# Patient Record
Sex: Male | Born: 1975 | Race: White | Hispanic: No | Marital: Married | Smoking: Former smoker
Health system: Southern US, Community
[De-identification: ages and names within clinical notes are randomized; demographics above are authoritative.]

## PROBLEM LIST (undated history)

## (undated) DIAGNOSIS — E7800 Pure hypercholesterolemia, unspecified: Secondary | ICD-10-CM

## (undated) DIAGNOSIS — Z87442 Personal history of urinary calculi: Secondary | ICD-10-CM

## (undated) DIAGNOSIS — K219 Gastro-esophageal reflux disease without esophagitis: Secondary | ICD-10-CM

## (undated) DIAGNOSIS — N200 Calculus of kidney: Secondary | ICD-10-CM

## (undated) DIAGNOSIS — R519 Headache, unspecified: Secondary | ICD-10-CM

## (undated) DIAGNOSIS — R51 Headache: Secondary | ICD-10-CM

## (undated) DIAGNOSIS — F419 Anxiety disorder, unspecified: Secondary | ICD-10-CM

## (undated) HISTORY — PX: CHOLECYSTECTOMY: SHX55

---

## 2005-06-10 ENCOUNTER — Emergency Department: Payer: Self-pay | Admitting: Emergency Medicine

## 2007-04-02 ENCOUNTER — Emergency Department: Payer: Self-pay | Admitting: Internal Medicine

## 2009-06-05 ENCOUNTER — Ambulatory Visit: Payer: Self-pay | Admitting: Internal Medicine

## 2009-06-27 ENCOUNTER — Ambulatory Visit: Payer: Self-pay | Admitting: Urology

## 2010-02-16 ENCOUNTER — Ambulatory Visit: Payer: Self-pay | Admitting: Internal Medicine

## 2010-02-20 ENCOUNTER — Ambulatory Visit: Payer: Self-pay | Admitting: Internal Medicine

## 2011-01-01 ENCOUNTER — Ambulatory Visit: Payer: Self-pay | Admitting: Urology

## 2013-02-26 ENCOUNTER — Ambulatory Visit: Payer: Self-pay | Admitting: Internal Medicine

## 2013-04-13 ENCOUNTER — Ambulatory Visit: Payer: Self-pay | Admitting: Surgery

## 2013-04-14 LAB — PATHOLOGY REPORT

## 2014-07-30 NOTE — Op Note (Signed)
PATIENT NAME:  Brian Bradley, Brian Bradley DATE OF BIRTH:  04/17/1975  DATE OF PROCEDURE:  04/13/2013  PREOPERATIVE DIAGNOSIS: Chronic cholecystitis.   POSTOPERATIVE DIAGNOSIS: Chronic cholecystitis and cholelithiasis.   PROCEDURE: Laparoscopic cholecystectomy and cholangiogram.   SURGEON: Renda RollsWilton Orange Hilligoss, M.D.   ANESTHESIA: General.   INDICATION: This [age] year old male has a history of right upper quadrant pain and ultrasound findings of a particular matter within the gallbladder either polyps versus stones and surgery was recommended for definitive treatment.   DESCRIPTION OF PROCEDURE: The patient was placed on the operating table in the supine position under general anesthesia. The abdomen was clipped and prepared with ChloraPrep and draped in a sterile manner. A short incision was made in the inferior aspect of the umbilicus and carried down to the deep fascia, which was grasped with laryngeal hook and elevated. A Veress needle was inserted, aspirated, and irrigated with a saline solution. Next, the peritoneal cavity was inflated with carbon dioxide. The Veress needle was removed. A 10 mm cannula was inserted. The 10 mm, 0 degree laparoscope was inserted to view the peritoneal cavity. The liver has a smooth surface. The gallbladder was identified, the stomach identified and brief survey of the abdomen revealed no other abnormalities.   Next, another incision was made in the epigastrium slightly to the right of the midline to introduce an 11 mm cannula. Two incisions were made in the lateral aspect of the right upper quadrant to introduce two 5 mm cannulas. With the patient in the reverse Trendelenburg position and turned several degrees to the left, the gallbladder was retracted towards the right shoulder. Multiple adhesions were taken down between the omentum and the gallbladder with blunt and sharp dissection. The infundibulum was retracted inferiorly and laterally. The gallbladder neck  was mobilized with incision of the visceral peritoneum. The cystic duct was dissected free from surrounding structures. The cystic artery was dissected free from surrounding structures. A critical view of safety was demonstrated. An Endo Clip was placed across the cystic duct adjacent to the neck of the gallbladder. An incision was made in the cystic duct to introduce a Reddick catheter. Half-strength Conray-60 dye was injected as the cholangiogram was done with fluoroscopy, viewing the biliary tree and prompt flow of dye into the duodenum. The pancreatic duct was also identified. The Reddick catheter was removed. The cystic duct was doubly ligated with endoclips and divided. The cystic artery was controlled with double endoclips and divided. The gallbladder was dissected free from the liver with hook and cautery and blunt dissection and completely separated. Hemostasis was intact. The gallbladder was delivered up through the infraumbilical incision, opened and suctioned, removed. It was opened and I could see stones within the gallbladder and this was submitted in formalin for routine pathology.   The right upper quadrant was further inspected. Hemostasis was intact. The cannulas were removed. Several small subcutaneous bleeding points were cauterized as carbon dioxide was allowed to escape from the peritoneal cavity. The skin incisions were closed with interrupted 5-0 chromic subcuticular sutures, benzoin, and Steri-Strips. Dressings were applied with paper tape. The patient tolerated surgery satisfactorily and was then prepared for transfer to the recovery room.    ____________________________ Brian CommonsJ. Renda RollsWilton Bladimir Auman, Brian Bradley jws:aw D: 04/13/2013 10:45:02 ET T: 04/13/2013 10:52:25 ET JOB#: [id number]  cc: Adella HareJ. Wilton Sherial Ebrahim, Brian Bradley, <Dictator> Adella HareWILTON J Makani Seckman Brian Bradley ELECTRONICALLY SIGNED 04/15/2013 19:51

## 2015-07-06 IMAGING — US ABDOMEN ULTRASOUND
1 series · 14 of 25 positions shown · non-contrast
Comparison: None.

CLINICAL DATA: Abdominal pain, nausea

EXAM:
ULTRASOUND ABDOMEN COMPLETE

[Series 1: abdomen ultrasound · 0.24mm/px · 14 of 75 slices shown]
[im 1/75]
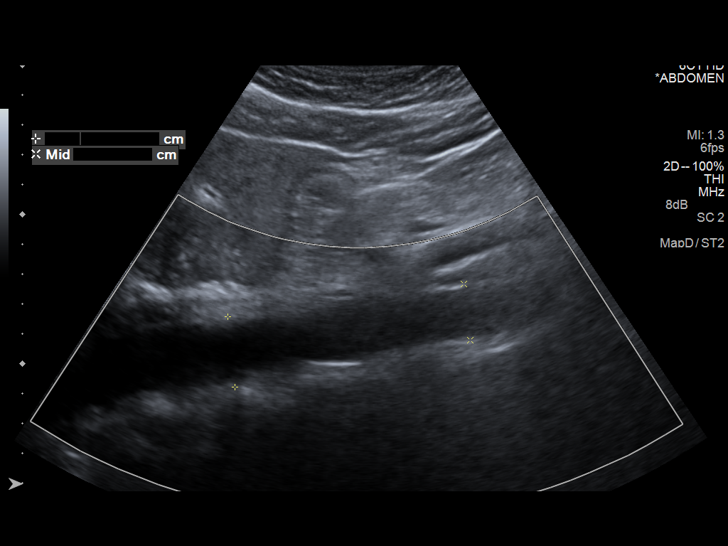
[im 7/75]
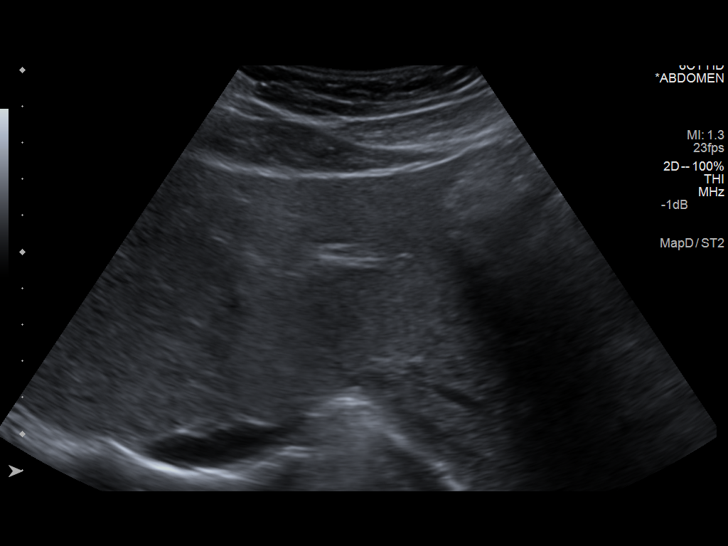
[im 13/75]
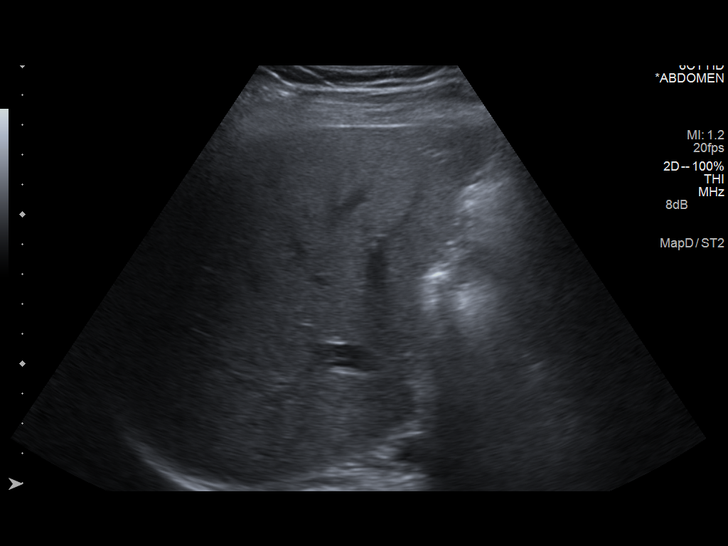
[im 19/75]
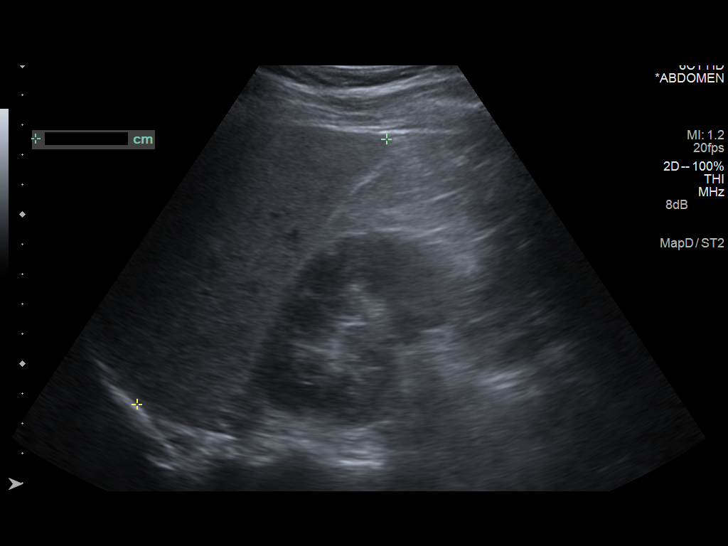
[im 25/75]
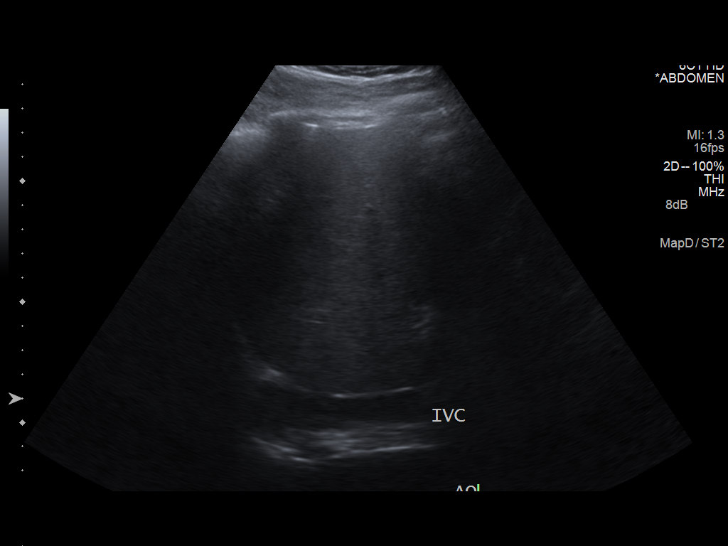
[im 28/75]
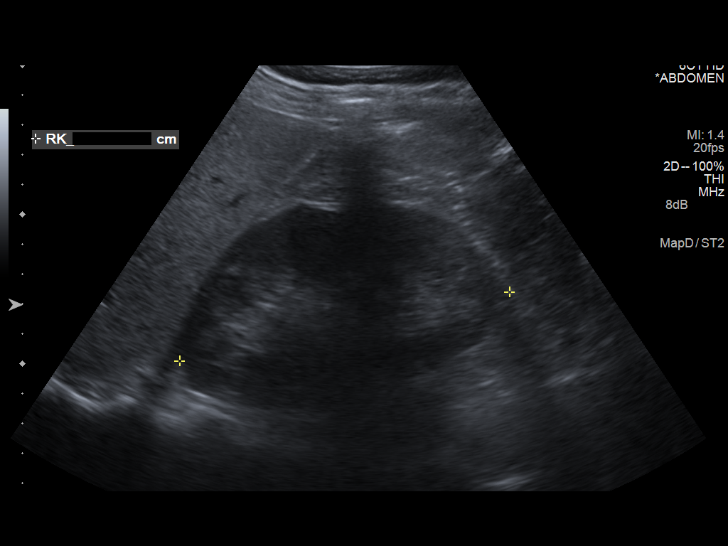
[im 34/75]
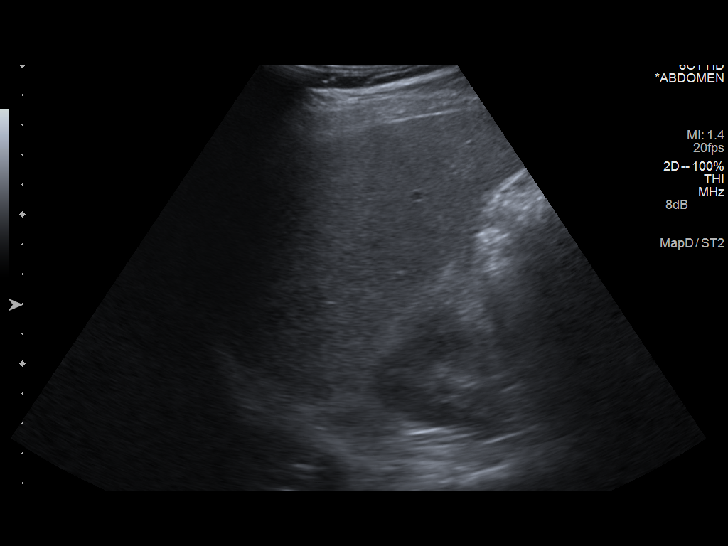
[im 41/75]
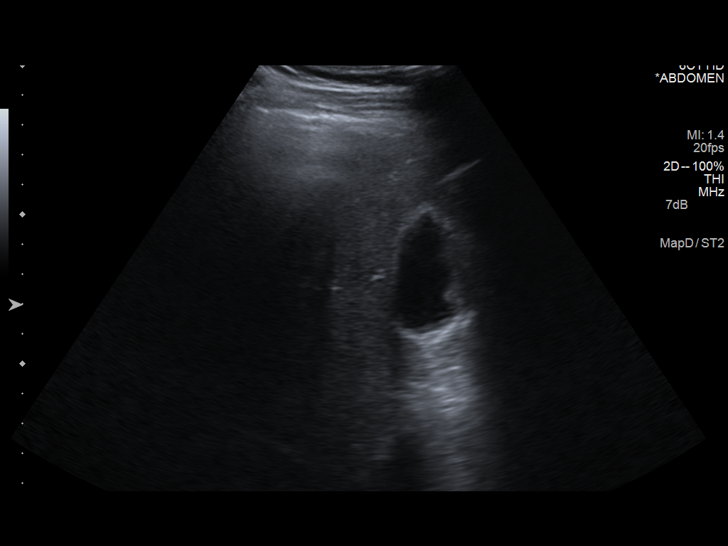
[im 47/75]
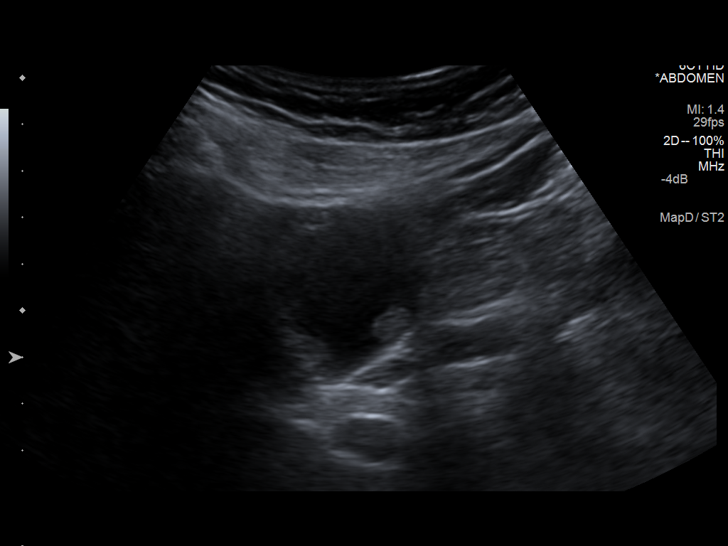
[im 50/75]
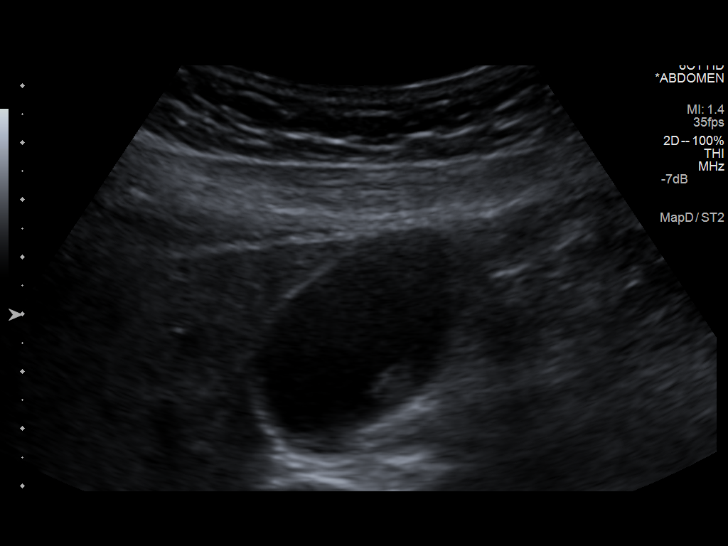
[im 56/75]
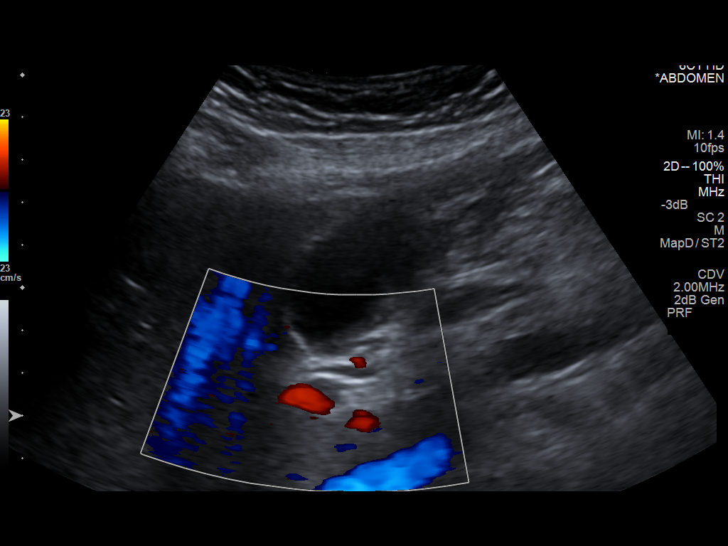
[im 62/75]
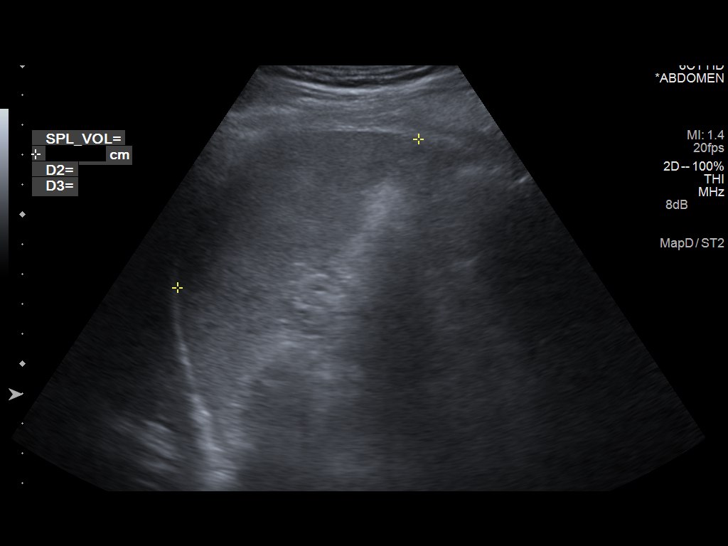
[im 68/75]
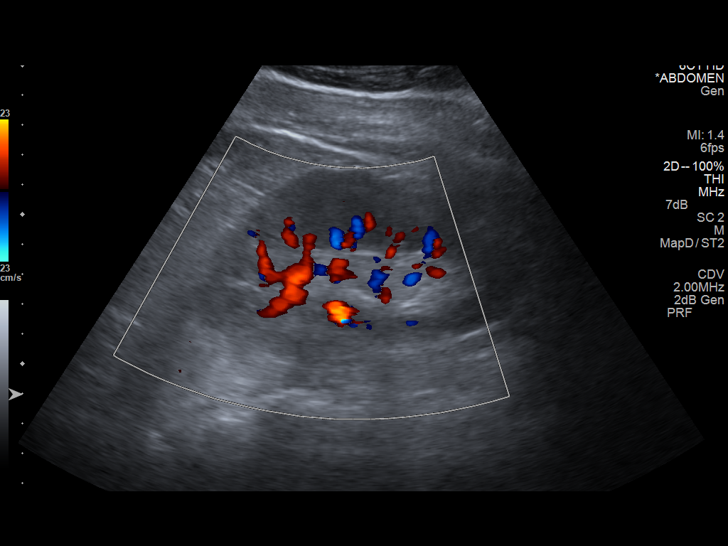
[im 75/75]
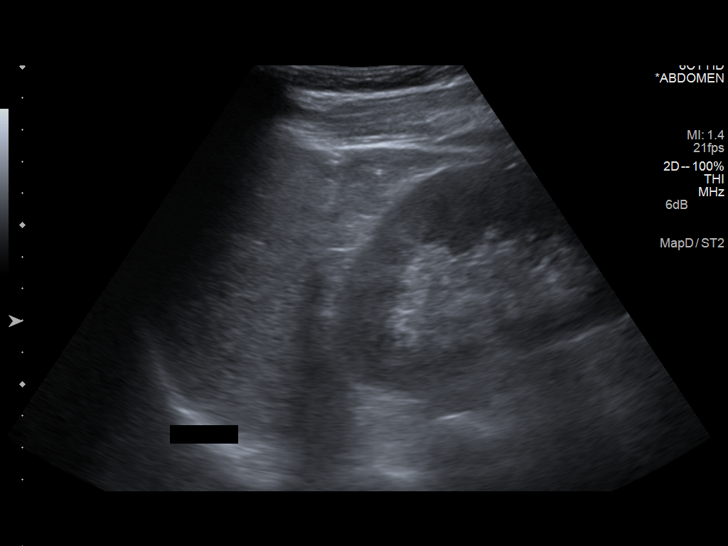

[14 of 25 positions shown; findings below may reference images not displayed]

FINDINGS: Gallbladder

Multiple non shadowing polypoid lesions along the gallbladder wall,
measuring up to 10 mm (image 55), without vascularity. While non
shadowing gallstones and/or sludge is possible, this appearance
favors gallbladder polyps.

Common bile duct

Diameter: 3 mm.

Liver

No focal lesion identified. Within normal limits in parenchymal
echogenicity.

IVC

No abnormality visualized.

Pancreas

Poorly visualized due to overlying bowel gas.

Spleen

Measures 5.4 cm.

Right Kidney

Length: 11.3 cm.  No mass or hydronephrosis.

Left Kidney

Length: 11.3 cm.  No mass or hydronephrosis.

Abdominal aorta

No aneurysm visualized.
IMPRESSION: Suspected gallbladder polyps measuring up to 10 mm. At this size,
consider surgical consultation for possible elective
cholecystectomy.

Otherwise negative abdominal ultrasound.

This recommendation follows ACR consensus guidelines: White Paper of
the ACR Incidental Findings Committee II on Gallbladder and Biliary
Findings. [HOSPITAL] 7334:;[DATE].

## 2015-07-18 ENCOUNTER — Other Ambulatory Visit: Payer: Self-pay | Admitting: Otolaryngology

## 2015-07-18 DIAGNOSIS — R590 Localized enlarged lymph nodes: Secondary | ICD-10-CM

## 2015-07-18 DIAGNOSIS — R221 Localized swelling, mass and lump, neck: Secondary | ICD-10-CM

## 2015-07-21 ENCOUNTER — Ambulatory Visit
Admission: RE | Admit: 2015-07-21 | Discharge: 2015-07-21 | Disposition: A | Discharge status: Home / Self Care | Payer: BLUE CROSS/BLUE SHIELD | Source: Ambulatory Visit | Attending: Otolaryngology | Admitting: Otolaryngology

## 2015-07-21 DIAGNOSIS — R221 Localized swelling, mass and lump, neck: Secondary | ICD-10-CM

## 2015-07-21 DIAGNOSIS — R938 Abnormal findings on diagnostic imaging of other specified body structures: Secondary | ICD-10-CM | POA: Diagnosis not present

## 2015-07-21 DIAGNOSIS — R590 Localized enlarged lymph nodes: Secondary | ICD-10-CM | POA: Diagnosis present

## 2015-07-21 MED ORDER — IOPAMIDOL (ISOVUE-370) INJECTION 76%
75.0000 mL | Freq: Once | INTRAVENOUS | Status: AC | PRN
  Administered 2015-07-21: 75 mL via INTRAVENOUS

## 2015-07-27 ENCOUNTER — Other Ambulatory Visit: Payer: Self-pay | Admitting: Otolaryngology

## 2015-07-27 DIAGNOSIS — R222 Localized swelling, mass and lump, trunk: Secondary | ICD-10-CM

## 2015-08-01 ENCOUNTER — Other Ambulatory Visit: Payer: Self-pay | Admitting: Radiology

## 2015-08-02 ENCOUNTER — Ambulatory Visit
Admission: RE | Admit: 2015-08-02 | Discharge: 2015-08-02 | Disposition: A | Discharge status: Home / Self Care | Payer: BLUE CROSS/BLUE SHIELD | Source: Ambulatory Visit | Attending: Otolaryngology | Admitting: Otolaryngology

## 2015-08-02 DIAGNOSIS — R591 Generalized enlarged lymph nodes: Secondary | ICD-10-CM | POA: Diagnosis not present

## 2015-08-02 DIAGNOSIS — R222 Localized swelling, mass and lump, trunk: Secondary | ICD-10-CM | POA: Diagnosis present

## 2015-08-02 HISTORY — DX: Headache: R51

## 2015-08-02 HISTORY — DX: Calculus of kidney: N20.0

## 2015-08-02 HISTORY — DX: Headache, unspecified: R51.9

## 2015-08-02 MED ORDER — SODIUM CHLORIDE 0.9 % IV SOLN
Freq: Once | INTRAVENOUS | Status: AC
  Administered 2015-08-02: 11:00:00 via INTRAVENOUS

## 2015-08-02 NOTE — Procedures (Signed)
Left supraclavicular lymph node biopsy without difficulty  Complications:  None  Blood Loss: none  See dictation in canopy pacs

## 2015-08-03 LAB — SURGICAL PATHOLOGY

## 2015-11-16 ENCOUNTER — Other Ambulatory Visit: Payer: Self-pay | Admitting: Physician Assistant

## 2015-11-16 DIAGNOSIS — H539 Unspecified visual disturbance: Secondary | ICD-10-CM

## 2015-11-16 DIAGNOSIS — G44209 Tension-type headache, unspecified, not intractable: Secondary | ICD-10-CM

## 2015-11-17 ENCOUNTER — Ambulatory Visit: Payer: BLUE CROSS/BLUE SHIELD

## 2016-07-21 IMAGING — US US BIOPSY
1 series · 13 of 16 positions shown · non-contrast
Comparison: none

INDICATION: LEFT SUPRACLAVICULAR LYMPHADENOPATHY

[Series 1: us biopsy · 0.06mm/px · 13 of 16 slices shown]
[im 1/16]
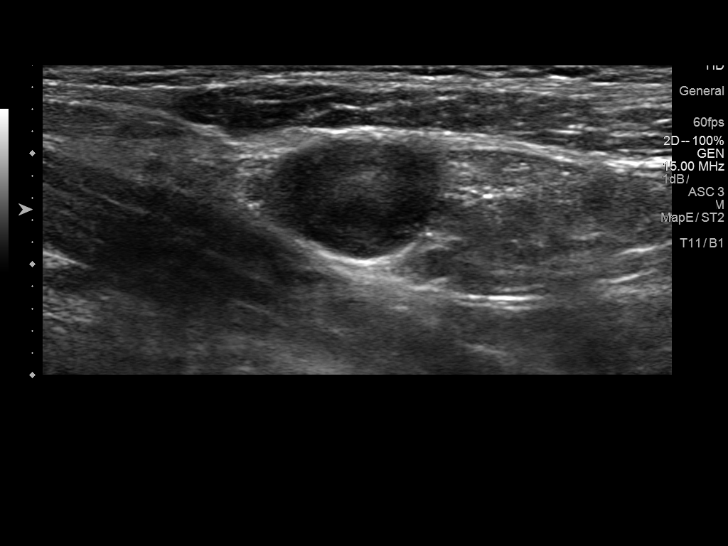
[im 2/16]
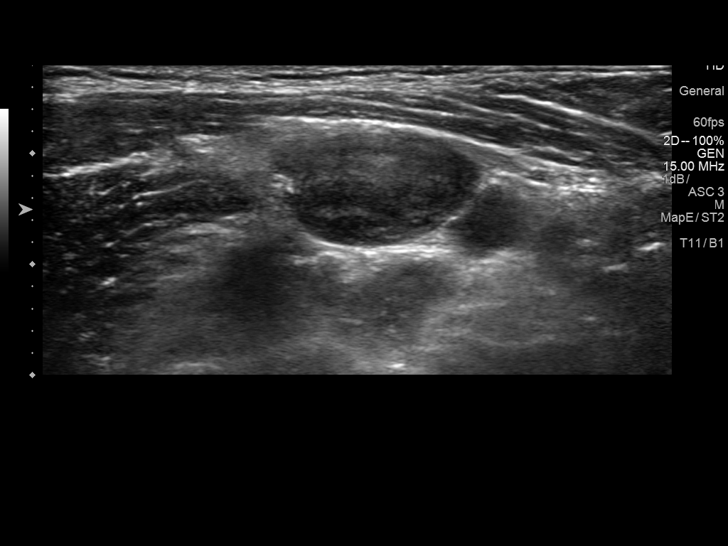
[im 4/16]
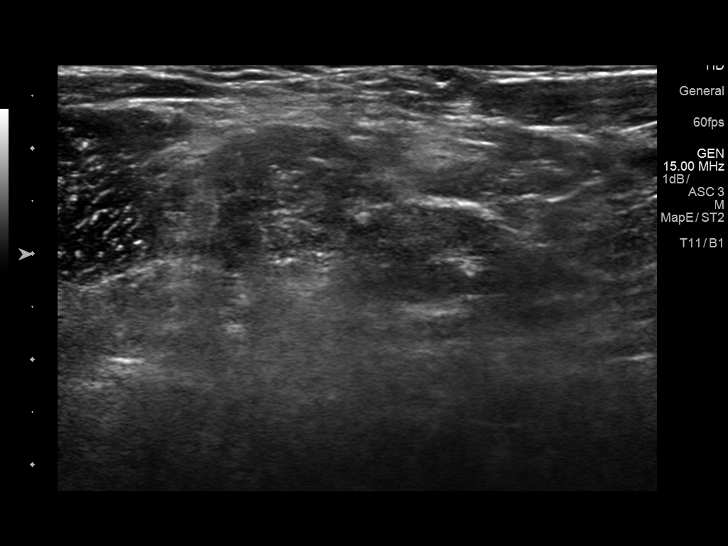
[im 5/16]
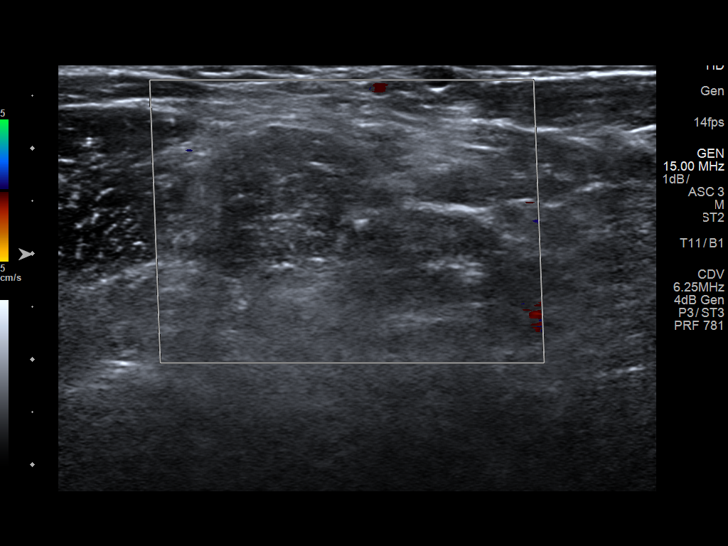
[im 6/16]
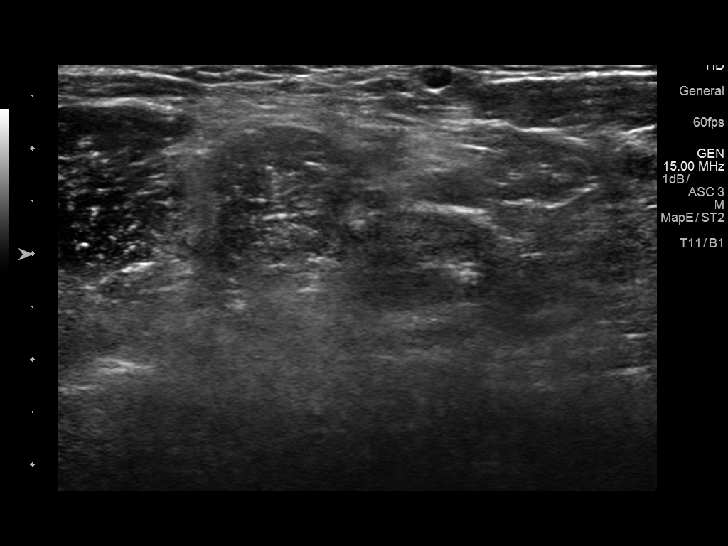
[im 7/16]
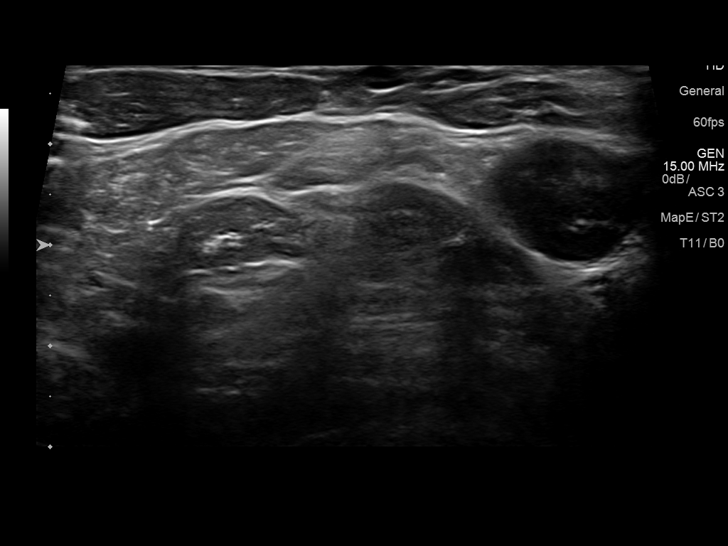
[im 9/16]
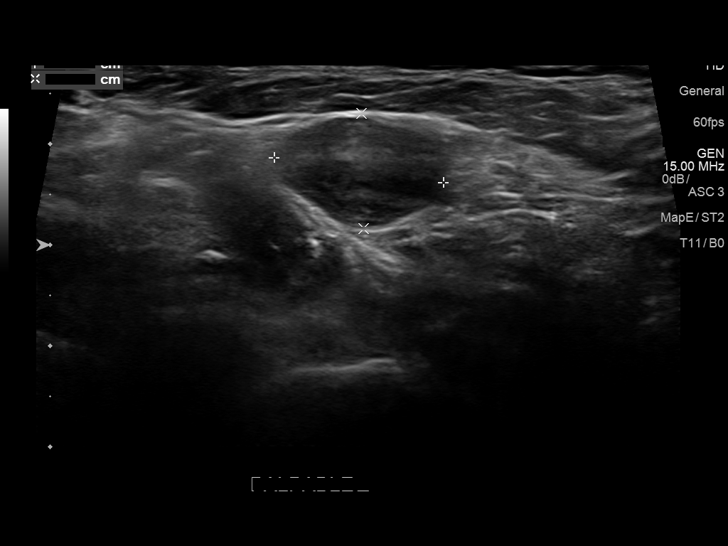
[im 10/16]
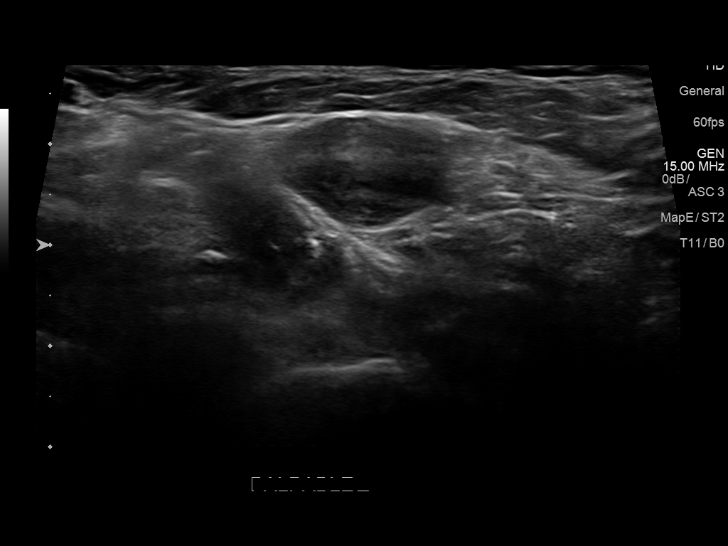
[im 11/16]
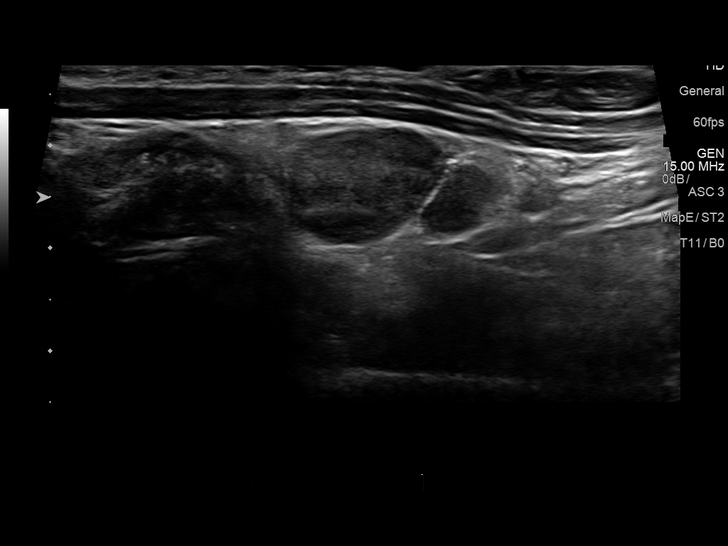
[im 12/16]
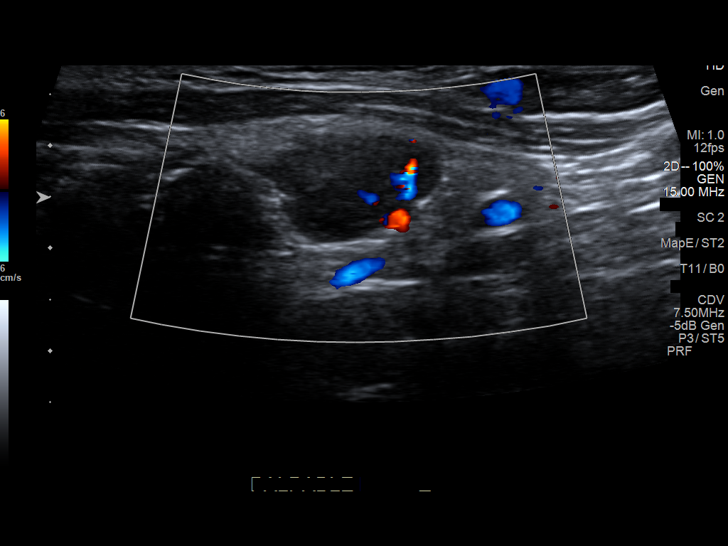
[im 13/16]
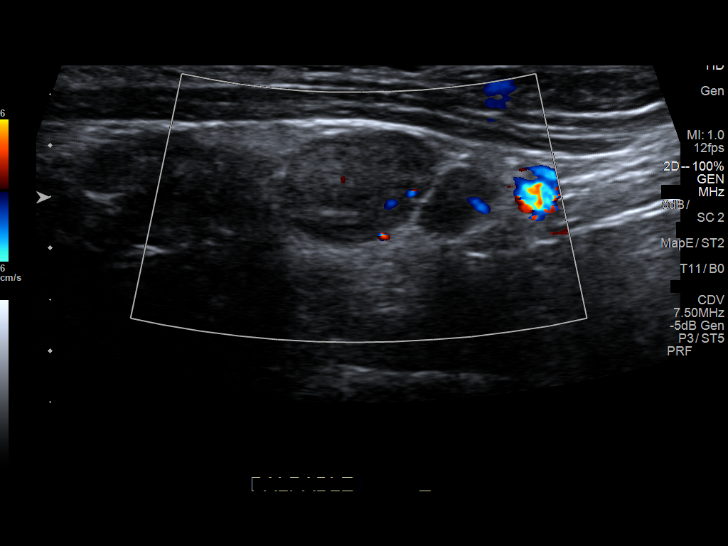
[im 15/16]
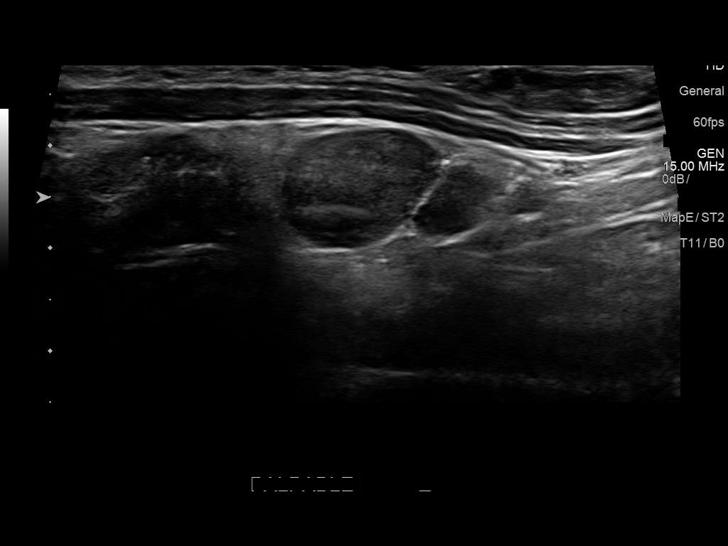
[im 16/16]
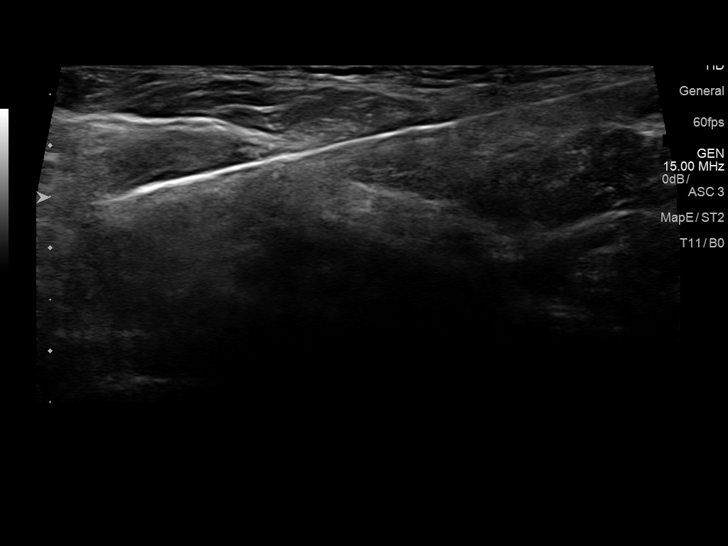

[13 of 16 positions shown; findings below may reference images not displayed]

EXAM:
LEFT SUPRACLAVICULAR LYMPH NODE BIOPSY

MEDICATIONS:
None.

ANESTHESIA/SEDATION:
NONE

FLUOROSCOPY TIME:  Not applicable

COMPLICATIONS:
None immediate.

PROCEDURE:
Informed written consent was obtained from the patient after a
thorough discussion of the procedural risks, benefits and
alternatives. All questions were addressed. Maximal Sterile Barrier
Technique was utilized including caps, mask, sterile gowns, sterile
gloves, sterile drape, hand hygiene and skin antiseptic. A timeout
was performed prior to the initiation of the procedure.

Localization of the known left supraclavicular lymph node was
performed. 1% xylocaine was utilized as a local anesthetic and deep
anesthetic. Utilizing real-time ultrasound guidance, a 17 gauge
guiding needle was placed percutaneously adjacent to the known left
supraclavicular lymph node. Real-time ultrasound guidance was
utilized throughout the procedure. Multiple 18 gauge core biopsies
were then obtained without difficulty. These were sent for
pathologic evaluation. The guiding needle was then removed and
hemostasis obtained at the puncture site. The patient tolerated the
procedure well and was returned to his room in satisfactory
condition.
IMPRESSION: Successful left supraclavicular lymph node biopsy under real-time
ultrasound guidance as described.

## 2017-04-08 HISTORY — PX: BACK SURGERY: SHX140

## 2017-11-27 IMAGING — CT CT NECK W/ CM
2 of 3 series · 7 of 14 positions shown, 8 images · IV contrast (isovue)
Comparison: None.

ADDENDUM:
Slightly lower than the area marked is a hyperdense mass measuring
1.9 x 2.3 x 2.3 cm. There are several adjacent hyperdense nodes
measuring up to 10 mm. This may represent lymphoma. This location
can often represent metastatic disease from distal GI tumors or lung
cancer.

This has been reviewed by Dr. Fawzia Tawadrous, Interventional
Radiology and is amenable to US guided biopsy.
CLINICAL DATA: Posterior left neck swelling over the last 3 weeks
with intermittent pain. Neck mass. Cervical lymphadenopathy.
EXAM:
CT NECK WITH CONTRAST
TECHNIQUE: Multidetector CT imaging of the neck was performed using the
standard protocol following the bolus administration of intravenous
contrast.
CONTRAST:  75 mL Isovue 370

[Series 2: axial neck · axial · 0.54mm/px · z∈[-309,-173]mm · 3 of 136 slices shown]
[im 34/136  bone]
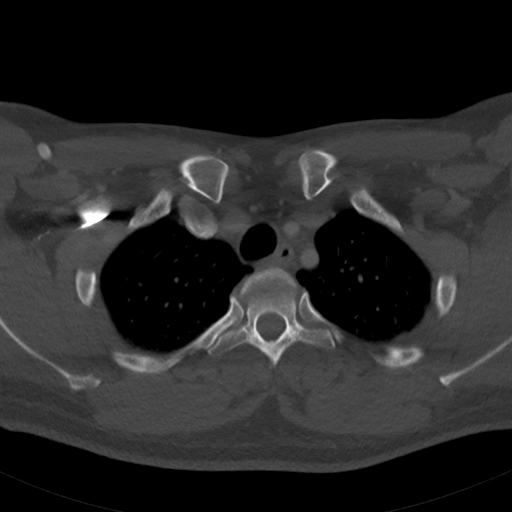
[im 68/136  bone]
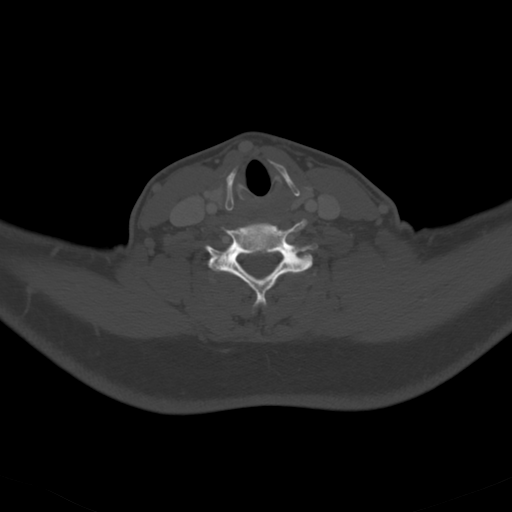
[im 102/136  bone]
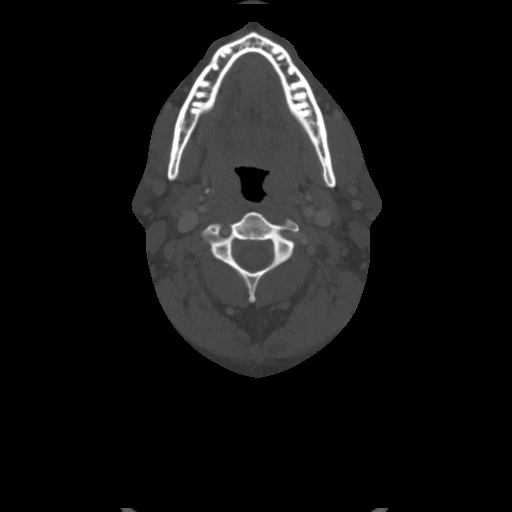

[Series 7: orthogonal ax · axial · 0.47mm/px · z∈[-346,-171]mm · 4 of 149 slices shown, 5 images]
[im 30/149  soft-tissue]
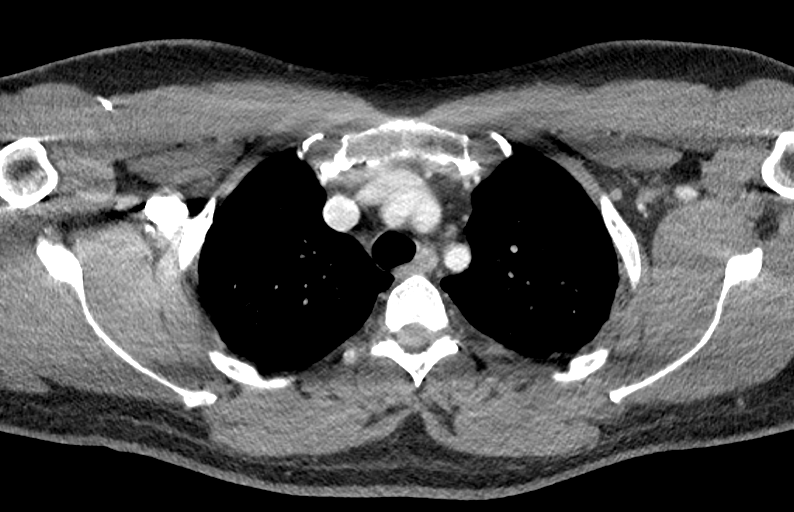
[im 30/149  bone]
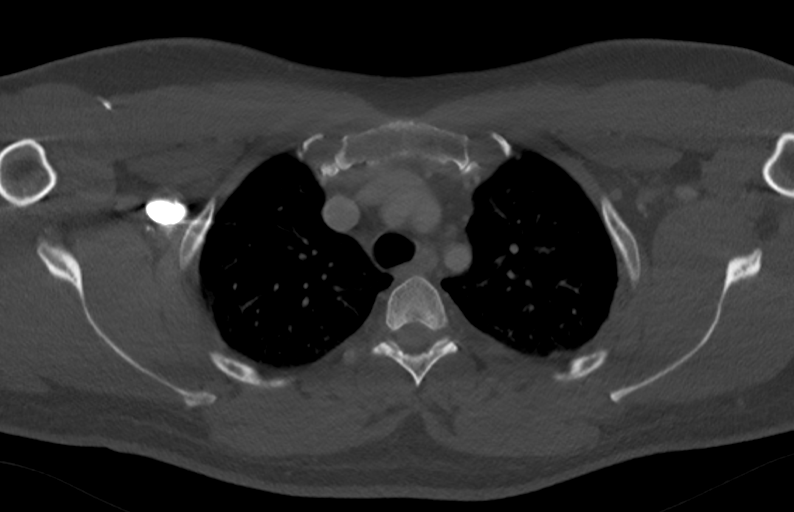
[im 60/149  bone]
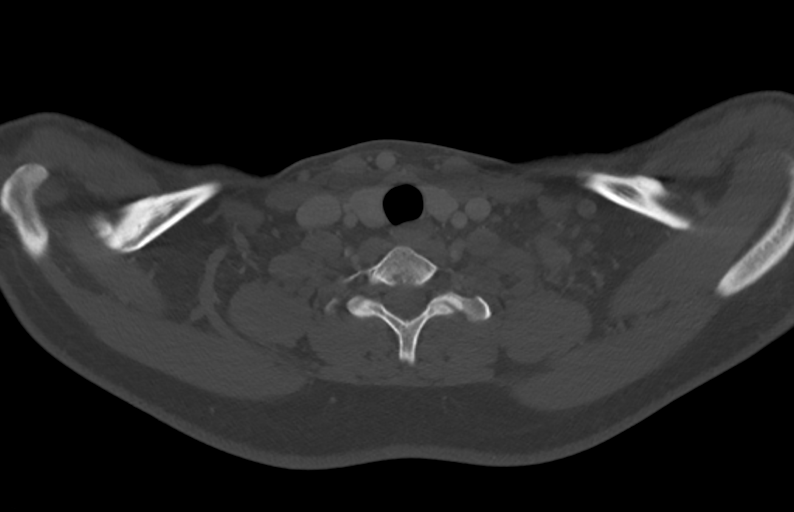
[im 89/149  bone]
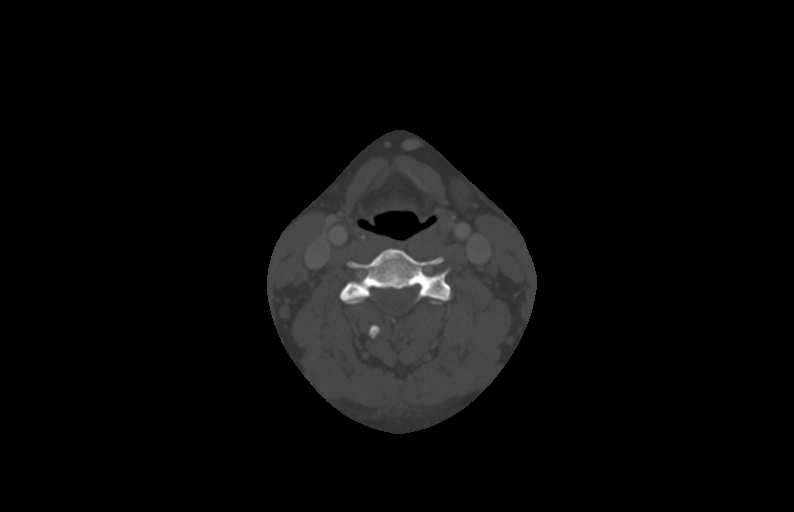
[im 119/149  bone]
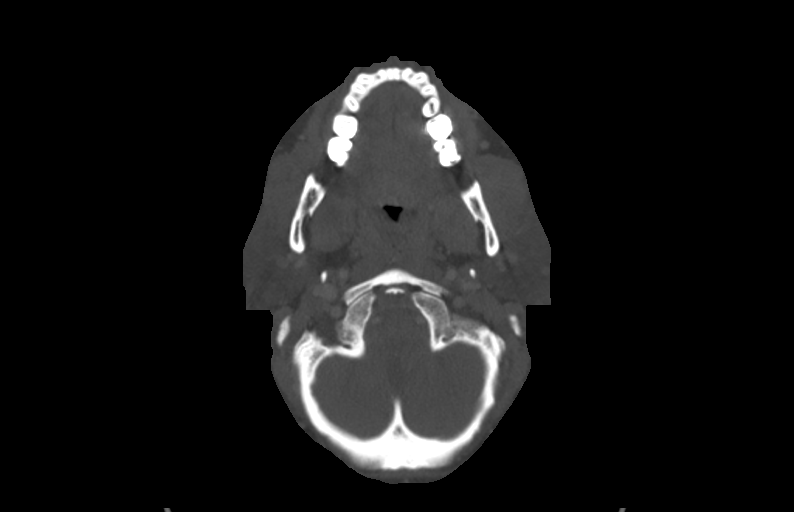

[7 of 14 positions shown; findings below may reference images not displayed]

FINDINGS: Pharynx and larynx: No focal mucosal or submucosal lesions are
present. The tongue base is within normal limits. There is mild
fullness of the palatine tonsils bilaterally without a discrete
mass. The vocal cords are midline and symmetric.

Salivary glands: The submandibular and parotid glands are within
normal limits bilaterally.

Thyroid: Negative

Lymph nodes: Bilateral ovoid cervical lymph nodes are present. A
posterior left cervical node at the C2-3 level measures up to 13 mm.
Smaller level 2 lymph nodes are present. A jugulodigastric lymph
node on the left measures 18 x 10 mm. A posterior left level 3 lymph
node measures 11 mm. A posterior triangle lymph nodes subjacent to
the area marked measures up to 10 mm.

A posterior right level 3 lymph node or likely 2 adjacent nodes
measuring 19 mm. The jugulodigastric lymph stone measures 18 x 10 mm
on the sagittal reformatted images.

Vascular: No significant vascular calcifications or stenosis are
present.

Limited intracranial: Within normal limits.

Visualized orbits: Not imaged

Mastoids and visualized paranasal sinuses: Clear

Skeleton: Mild endplate degenerative changes and uncovertebral
spurring is present in the cervical spine. The most pronounced level
is at C6-7 with foraminal narrowing right greater than left.

Upper chest: The lung apices are clear.
IMPRESSION: 1. Bilateral mildly enlarged cervical lymph nodes are ovoid in shape
and appear benign. These are likely reactive in the absence of known
neoplasm.
2. There is a 10 mm node subjacent to the area marked which is
likely the palpable lesion.
3. An additional 13 mm higher left cervical lymph node appears
mildly inflamed.
4. No primary mucosal or submucosal lesions.
5. Focal endplate degenerative changes at C6-7 with right greater
than left osseous foraminal narrowing.

## 2017-12-02 ENCOUNTER — Ambulatory Visit (INDEPENDENT_AMBULATORY_CARE_PROVIDER_SITE_OTHER): Payer: Worker's Compensation | Admitting: Orthopaedic Surgery

## 2017-12-02 ENCOUNTER — Encounter (INDEPENDENT_AMBULATORY_CARE_PROVIDER_SITE_OTHER): Payer: Self-pay | Admitting: Orthopaedic Surgery

## 2017-12-02 VITALS — BP 142/96 | HR 75 | Ht 74.0 in | Wt 230.0 lb

## 2017-12-02 DIAGNOSIS — M5010 Cervical disc disorder with radiculopathy, unspecified cervical region: Secondary | ICD-10-CM | POA: Insufficient documentation

## 2017-12-02 NOTE — Progress Notes (Signed)
Office Visit Note/IME visit for right C6-7 HNP   Patient: Brian Bradley           Date of Birth: 1975/07/19           MRN: [id number] Visit Date: 12/02/2017              Requested by: Marguarite Arbour, MD [address] [facility] [place], [place] [address] PCP: Marguarite Arbour, MD   Assessment & Plan: Visit Diagnoses:  1. Herniation of cervical intervertebral disc with radiculopathy     Plan: Patient has a symptomatic disc herniation on the right at C6-7 with compression and persistent radicular symptoms.  He has had appropriate treatment with conservative medication, physical therapy, appropriate epidural steroid injection and has not gotten relief.  Surgery is recommended for single level procedure either disc arthroplasty or single level cervical fusion.  He should get good relief and normal impairment rating is in line with [employer] act guidelines for the procedure.  With the procedure patient should be able to resume normal work activity without restrictions.  Follow-Up Instructions: None. Patient is for IME visit.    Orders:  No orders of the defined types were placed in this encounter.  No orders of the defined types were placed in this encounter.     Procedures: No procedures performed   Clinical Data: No additional findings.   Subjective: Chief Complaint  Patient presents with  . Neck - Pain    IME  . 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  He experienced neck and right shoulder pain said persistent symptoms.  He was treated conservatively ultimately had an MRI scan went through physical therapy is taken anti-inflammatories muscle relaxants had a single epidural steroid injection the cervical spine which did not give him sustained and now surgery is been  recommended cervical disc arthroplasty versus single level fusion.  MRI scan cervical spine 08/14/2017 showed a disc extrusion right C6-7 with moderate foraminal stenosis.  He had small asymptomatic central disc osteophyte complex at C4-5 without compression.  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  Proximally 35 pages of notes were available for review including office visit with Dr.Dimmig, MRI reports outline of therapy, epidural steroid injections.  Review of Systems previous gallbladder removal about 2015.  Positive history of migraines, negative for cardiovascular negative for hypertension.  Previous MRI shoulder showed some mild acromioclavicular arthrosis with intact rotator cuff.  14 point review of systems otherwise negative as it pertains HPI.   Objective: Vital Signs: BP (!) 142/96   Pulse 75   Ht 6\' 2"  (1.88 m)   Wt 230 lb (104.3 kg)   BMI 29.53 kg/m   Physical Exam  Constitutional: He is oriented to person, place, and time. He appears well-developed and well-nourished.  HENT:  Head: Normocephalic and atraumatic.  Eyes: Pupils are equal, round, and reactive to light. EOM are normal.  Neck: No tracheal deviation present. No thyromegaly present.  Cardiovascular: Normal rate.  Pulmonary/Chest: Effort normal. He has no wheezes.  Abdominal: Soft. Bowel sounds are normal.  Neurological: He is alert and oriented to person, place, and time.  Skin: Skin is warm and dry. Capillary refill takes less than 2 seconds.  Psychiatric: He has a normal mood  and affect. His behavior is normal. Judgment and thought content normal.    Ortho Exam patient has significant brachial plexus tenderness on the right positive Spurling on the right.  He has right triceps weakness not able to do a push-up.  He can do a male push-up off of his knees but has tremoring of the triceps.  No left brachial plexus tenderness.  Biceps brachioradialis reflex are 2+ 2+ left  tricep and trace right triceps reflex.  Slight wrist extension weakness normal grip right and left.  Normal heel toe gait no lower extremity clonus.  Specialty Comments:  No specialty comments available.  Imaging: No results found.   PMFS History: There are no active problems to display for this patient.  Past Medical History:  Diagnosis Date  . Headache   . Kidney stone     No family history on file.  Past Surgical History:  Procedure Laterality Date  . CHOLECYSTECTOMY     Social History   Occupational History  . Not on file  Tobacco Use  . Smoking status: Former Smoker    Last attempt to quit: 08/01/2005    Years since quitting: 12.3  . Smokeless tobacco: Former Engineer, [employer]  Substance and Sexual Activity  . Alcohol use: Yes    Comment: rarely  . Drug use: Not on file  . Sexual activity: Not on file

## 2017-12-03 ENCOUNTER — Telehealth (INDEPENDENT_AMBULATORY_CARE_PROVIDER_SITE_OTHER): Payer: Self-pay

## 2017-12-03 NOTE — Telephone Encounter (Signed)
-----   Message from Eldred MangesMark C Yates, MD sent at 12/02/2017  1:48 PM EDT ----- Cc to [employer] and also CC to Dr.Thomas Dimmig and [facility]

## 2017-12-03 NOTE — Telephone Encounter (Signed)
Faxed yesterdays note to [facility] adj Carlean PurlMary Stephens [phone]

## 2019-12-29 ENCOUNTER — Encounter: Payer: Self-pay | Admitting: Urology

## 2019-12-29 ENCOUNTER — Other Ambulatory Visit: Payer: Self-pay

## 2019-12-29 ENCOUNTER — Ambulatory Visit: Payer: BC Managed Care – PPO | Admitting: Urology

## 2019-12-29 VITALS — BP 118/77 | HR 67 | Ht 74.0 in | Wt 235.0 lb

## 2019-12-29 DIAGNOSIS — Z3009 Encounter for other general counseling and advice on contraception: Secondary | ICD-10-CM

## 2019-12-29 NOTE — Patient Instructions (Signed)

## 2019-12-30 ENCOUNTER — Encounter: Payer: Self-pay | Admitting: Urology

## 2019-12-30 NOTE — Progress Notes (Signed)
12/29/2019 7:00 AM   Brian Bradley Apr 29, 1975 [id number]  Referring provider: Marguarite Arbour, MD [address] [facility] [place],  [place] [address]  Chief Complaint  Patient presents with  . VAS Consult    HPI: Emmet Messer is a y.o. male who presents for vasectomy counseling.  . Married with 2 children . Denies prior history chronic scrotal pain, epididymitis or orchitis . Prior history stone disease . No previous history inguinal/pelvic hernia . No history of bleeding or clotting disorders   PMH: Past Medical History:  Diagnosis Date  . Headache   . Kidney stone     Surgical History: Past Surgical History:  Procedure Laterality Date  . CHOLECYSTECTOMY      Home Medications:  Allergies as of 12/29/2019   No Known Allergies     Medication List       Accurate as of December 29, 2019 11:59 PM. If you have any questions, ask your nurse or doctor.        ciclopirox 8 % solution Commonly known as: PENLAC Apply topically.   cyclobenzaprine 10 MG tablet Commonly known as: FLEXERIL Take 10 mg by mouth 3 (three) times daily.   eletriptan 20 MG tablet Commonly known as: RELPAX Take 20 mg by mouth as needed for migraine or headache (have not used in at least 6 months). May repeat in 2 hours if headache persists or recurs.   gabapentin 300 MG capsule Commonly known as: NEURONTIN TAKE 1 CAP AT BEDTIME FOR 3 DAY, THEN 1 CAP TWICE DAILY FOR 3 DAY, THEN 1 CAP 3 TIME DAILY AND CONT   naproxen sodium 220 MG tablet Commonly known as: ALEVE Take 220 mg by mouth 2 (two) times daily with a meal.   SUMAtriptan 100 MG tablet Commonly known as: IMITREX sumatriptan 100 mg tablet       Allergies: No Known Allergies  Family History: History reviewed. No pertinent family history.  Social History:  reports that he quit smoking about 14 years ago. He has quit using smokeless tobacco. He reports current alcohol use. No history on file for  drug use.   Physical Exam: BP 118/77   Pulse 67   Ht 6\' 2"  (1.88 m)   Wt 235 lb (106.6 kg)   BMI 30.17 kg/m   Constitutional:  Alert and oriented, No acute distress. HEENT: [place] AT, moist mucus membranes.  Trachea midline, no masses. Cardiovascular: No clubbing, cyanosis, or edema. Respiratory: Normal respiratory effort, no increased work of breathing. GI: Abdomen is soft, nontender, nondistended, no abdominal masses GU: Phallus without lesions, testes descended bilaterally without masses or tenderness, spermatic cord/epididymis palpably normal bilaterally.  Vasa palpable bilaterally Skin: No rashes, bruises or suspicious lesions. Neurologic: Grossly intact, no focal deficits, moving all 4 extremities. Psychiatric: Normal mood and affect.   Assessment & Plan:    1.  Undesired fertility . We had a long discussion about vasectomy. We specifically discussed the procedure, recovery and the risks, benefits and alternatives of vasectomy. I explained that the procedure entails removal of a segment of each vas deferens, each of which conducts sperm, and that the purpose of this procedure is to cause sterility (inability to produce children or cause pregnancy). Vasectomy is intended to be permanent and irreversible form of contraception. Options for fertility after vasectomy include vasectomy reversal, or sperm retrieval with in vitro fertilization. These options are not always successful, and they may be expensive. We discussed reversible forms of birth control such as  condoms, IUD or diaphragms, as well as the option of freezing sperm in a sperm bank prior to the vasectomy procedure. We discussed the importance of avoiding strenuous exercise for four days after vasectomy, and the importance of refraining from any form of ejaculation for seven days after vasectomy. I explained that vasectomy does not produce immediate sterility so another form of contraceptive must be used until sterility is assured by  having semen checked for sperm. Thus, a post vasectomy semen analysis is necessary to confirm sterility. Rarely, vasectomy must be repeated. We discussed the approximately 1 in 2,000 risk of pregnancy after vasectomy for men who have post-vasectomy semen analysis showing absent sperm or rare non-motile sperm. Typical side effects include a small amount of oozing blood, some discomfort and mild swelling in the area of incision.  Vasectomy does not affect sexual performance, function, please, sensation, interest, desire, satisfaction, penile erection, volume of semen or ejaculation. Other rare risks include allergy or adverse reaction to an anesthetic, testicular atrophy, hematoma, infection/abscess, prolonged tenderness of the vas deferens, pain, swelling, painful nodule or scar (called sperm granuloma) or epididymtis. We discussed chronic testicular pain syndrome. This has been reported to occur in as many as 1-2% of men and may be permanent. This can be treated with medication, small procedures or (rarely) surgery. Marland Kitchen He wanted to check his work schedule and discussed with wife prior to scheduling and will call back . He indicated he would call back if he desires a preprocedure anxiolytic and would need a driver if utilizing   Riki Altes, MD  [facility] [address], [address] [place], [place] [address] [phone]

## 2020-06-12 ENCOUNTER — Ambulatory Visit: Payer: Self-pay | Admitting: Surgery

## 2020-06-12 NOTE — H&P (View-Only) (Signed)
Subjective:   CC: Incisional hernia, without obstruction or gangrene [K43.2]  HPI:  Brian Bradley is a 44 y.o. male who was referred by Jason Hestle Whitaker, * for evaluation of above. Symptoms were first noted several weeks ago. Pain is sharp, confined to the periumbilicus, without radiation.  Associated with lump, exacerbated by nothing specific  Lump is reducible.    Hx of lap chole in the past.   Past Medical History:  has a past medical history of Hyperlipidemia.  Past Surgical History:  Past Surgical History:  Procedure Laterality Date  . CHOLECYSTECTOMY    . SPINE SURGERY  2019    Family History: family history includes Coronary Artery Disease (Blocked arteries around heart) in his father; Headaches in his mother.  Social History:  reports that he quit smoking about 17 years ago. His smoking use included cigarettes. He quit after 13.00 years of use. He has never used smokeless tobacco. He reports previous alcohol use. He reports that he does not use drugs.  Current Medications: has a current medication list which includes the following prescription(s): methocarbamol, sumatriptan, and meloxicam.  Allergies:  Allergies as of 06/12/2020  . (No Known Allergies)    ROS:  A 15 point review of systems was performed and pertinent positives and negatives noted in HPI   Objective:     BP 118/73   Pulse 72   Ht 180.3 cm (5' 11")   Wt (!) 106.1 kg (234 lb)   BMI 32.64 kg/m   Constitutional :  alert, appears stated age, cooperative and no distress  Lymphatics/Throat:  no asymmetry, masses, or scars  Respiratory:  clear to auscultation bilaterally  Cardiovascular:  regular rate and rhythm  Gastrointestinal: soft, non-tender; bowel sounds normal; no masses,  no organomegaly. incisional hernia noted.  small, reducible and no overlying skin changes  Musculoskeletal: Steady gait and movement  Skin: Cool and moist, visible surgical scars   Psychiatric: Normal affect,  non-agitated, not confused       LABS:  n/a   RADS: n/a Assessment:       Incisional hernia, without obstruction or gangrene [K43.2], Likely at lap chole port site based on location  Plan:     1. Incisional hernia, without obstruction or gangrene [K43.2]   Discussed the risk of surgery including recurrence, which can be up to 50% in the case of incisional or complex hernias, possible use of prosthetic materials (mesh) and the increased risk of mesh infxn if used, bleeding, chronic pain, post-op infxn, post-op SBO or ileus, and possible re-operation to address said risks. The risks of general anesthetic, if used, includes MI, CVA, sudden death or even reaction to anesthetic medications also discussed. Alternatives include continued observation.  Benefits include possible symptom relief, prevention of incarceration, strangulation, enlargement in size over time, and the risk of emergency surgery in the face of strangulation.   Typical post-op recovery time of 3-5 days with 2 weeks of activity restrictions were also discussed.  ED return precautions given for sudden increase in pain, size of hernia with accompanying fever, nausea, and/or vomiting.  The patient verbalized understanding and all questions were answered to the patient's satisfaction.   2. Patient has elected to proceed with surgical treatment. Procedure will be scheduled.  Written consent was obtained. open, due to small size    

## 2020-06-12 NOTE — H&P (Signed)
Subjective:   CC: Incisional hernia, without obstruction or gangrene [K43.2]  HPI:  Brian Bradley is a 45 y.o. male who was referred by Wilford Corner, * for evaluation of above. Symptoms were first noted several weeks ago. Pain is sharp, confined to the periumbilicus, without radiation.  Associated with lump, exacerbated by nothing specific  Lump is reducible.    Hx of lap chole in the past.   Past Medical History:  has a past medical history of Hyperlipidemia.  Past Surgical History:  Past Surgical History:  Procedure Laterality Date  . CHOLECYSTECTOMY    . SPINE SURGERY  2019    Family History: family history includes Coronary Artery Disease (Blocked arteries around heart) in his father; Headaches in his mother.  Social History:  reports that he quit smoking about 17 years ago. His smoking use included cigarettes. He quit after 13.00 years of use. He has never used smokeless tobacco. He reports previous alcohol use. He reports that he does not use drugs.  Current Medications: has a current medication list which includes the following prescription(s): methocarbamol, sumatriptan, and meloxicam.  Allergies:  Allergies as of 06/12/2020  . (No Known Allergies)    ROS:  A 15 point review of systems was performed and pertinent positives and negatives noted in HPI   Objective:     BP 118/73   Pulse 72   Ht 180.3 cm (5\' 11" )   Wt (!) 106.1 kg (234 lb)   BMI 32.64 kg/m   Constitutional :  alert, appears stated age, cooperative and no distress  Lymphatics/Throat:  no asymmetry, masses, or scars  Respiratory:  clear to auscultation bilaterally  Cardiovascular:  regular rate and rhythm  Gastrointestinal: soft, non-tender; bowel sounds normal; no masses,  no organomegaly. incisional hernia noted.  small, reducible and no overlying skin changes  Musculoskeletal: Steady gait and movement  Skin: Cool and moist, visible surgical scars   Psychiatric: Normal affect,  non-agitated, not confused       LABS:  n/a   RADS: n/a Assessment:       Incisional hernia, without obstruction or gangrene [K43.2], Likely at lap chole port site based on location  Plan:     1. Incisional hernia, without obstruction or gangrene [K43.2]   Discussed the risk of surgery including recurrence, which can be up to 50% in the case of incisional or complex hernias, possible use of prosthetic materials (mesh) and the increased risk of mesh infxn if used, bleeding, chronic pain, post-op infxn, post-op SBO or ileus, and possible re-operation to address said risks. The risks of general anesthetic, if used, includes MI, CVA, sudden death or even reaction to anesthetic medications also discussed. Alternatives include continued observation.  Benefits include possible symptom relief, prevention of incarceration, strangulation, enlargement in size over time, and the risk of emergency surgery in the face of strangulation.   Typical post-op recovery time of 3-5 days with 2 weeks of activity restrictions were also discussed.  ED return precautions given for sudden increase in pain, size of hernia with accompanying fever, nausea, and/or vomiting.  The patient verbalized understanding and all questions were answered to the patient's satisfaction.   2. Patient has elected to proceed with surgical treatment. Procedure will be scheduled.  Written consent was obtained. open, due to small size

## 2020-06-16 ENCOUNTER — Encounter
Admission: RE | Admit: 2020-06-16 | Discharge: 2020-06-16 | Disposition: A | Discharge status: Home / Self Care | Payer: BC Managed Care – PPO | Source: Ambulatory Visit | Attending: Surgery | Admitting: Surgery

## 2020-06-16 ENCOUNTER — Other Ambulatory Visit: Payer: Self-pay

## 2020-06-16 HISTORY — DX: Personal history of urinary calculi: Z87.442

## 2020-06-16 HISTORY — DX: Anxiety disorder, unspecified: F41.9

## 2020-06-16 HISTORY — DX: Gastro-esophageal reflux disease without esophagitis: K21.9

## 2020-06-16 NOTE — Patient Instructions (Addendum)
Your procedure is scheduled on:06-23-20 FRIDAY Report to the Registration Desk on the 1st floor of the Medical Mall-Then proceed to the 2nd floor Surgery Desk in the Medical Mall To find out your arrival time, please call [phone] between 1PM - 3PM on:06-22-20 THURSDAY  REMEMBER: Instructions that are not followed completely may result in serious medical risk, up to and including death; or upon the discretion of your surgeon and anesthesiologist your surgery may need to be rescheduled.  Do not eat food after midnight the night before surgery.  No gum chewing, lozengers or hard candies.  You may however, drink CLEAR liquids up to 2 hours before you are scheduled to arrive for your surgery. Do not drink anything within 2 hours of your scheduled arrival time.  Clear liquids include: - water  - apple juice without pulp - gatorade (not RED, PURPLE, OR BLUE) - black coffee or tea (Do NOT add milk or creamers to the coffee or tea) Do NOT drink anything that is not on this list.  TAKE THESE MEDICATIONS THE MORNING OF SURGERY WITH A SIP OF WATER: -ROBAXIN (METHOCARBAMOL)  One week prior to surgery: Stop Anti-inflammatories (NSAIDS) such as MOBIC (MELOXICAM), Advil, Aleve, Ibuprofen, Motrin, Naproxen, Naprosyn and Aspirin based products such as Excedrin, Goodys Powder, BC Powder-OK TO TAKE TYLENOL/IMITREX IF NEEDED  Stop ANY OVER THE COUNTER supplements until after surgery.  No Alcohol for 24 hours before or after surgery.  No Smoking including e-cigarettes for 24 hours prior to surgery.  No chewable tobacco products for at least 6 hours prior to surgery.  No nicotine patches on the day of surgery.  Do not use any "recreational" drugs for at least a week prior to your surgery.  Please be advised that the combination of cocaine and anesthesia may have negative outcomes, up to and including death. If you test positive for cocaine, your surgery will be cancelled.  On the morning of  surgery brush your teeth with toothpaste and water, you may rinse your mouth with mouthwash if you wish. Do not swallow any toothpaste or mouthwash.  Do not wear jewelry, make-up, hairpins, clips or nail polish.  Do not wear lotions, powders, or perfumes.   Do not shave body from the neck down 48 hours prior to surgery just in case you cut yourself which could leave a site for infection.  Also, freshly shaved skin may become irritated if using the CHG soap.  Contact lenses, hearing aids and dentures may not be worn into surgery.  Do not bring valuables to the hospital. [facility] is not responsible for any missing/lost belongings or valuables.   Use CHG Soap as directed on instruction sheet.  Notify your doctor if there is any change in your medical condition (cold, fever, infection).  Wear comfortable clothing (specific to your surgery type) to the hospital.  Plan for stool softeners for home use; pain medications have a tendency to cause constipation. You can also help prevent constipation by eating foods high in fiber such as fruits and vegetables and drinking plenty of fluids as your diet allows.  After surgery, you can help prevent lung complications by doing breathing exercises.  Take deep breaths and cough every 1-2 hours. Your doctor may order a device called an Incentive Spirometer to help you take deep breaths. When coughing or sneezing, hold a pillow firmly against your incision with both hands. This is called "splinting." Doing this helps protect your incision. It also decreases belly discomfort.  If you  are being admitted to the hospital overnight, leave your suitcase in the car. After surgery it may be brought to your room.  If you are being discharged the day of surgery, you will not be allowed to drive home. You will need a responsible adult (18 years or older) to drive you home and stay with you that night.   If you are taking public transportation, you will need to  have a responsible adult (18 years or older) with you. Please confirm with your physician that it is acceptable to use public transportation.   Please call the Pre-admissions Testing Dept. at [phone] if you have any questions about these instructions.  Surgery Visitation Policy:  Patients undergoing a surgery or procedure may have one family member or support person with them as long as that person is not COVID-19 positive or experiencing its symptoms.  That person may remain in the waiting area during the procedure.  Inpatient Visitation:    Visiting hours are 7 a.m. to 8 p.m. Inpatients will be allowed two visitors daily. The visitors may change each day during the patient's stay. No visitors under the age of 4. Any visitor under the age of 58 must be accompanied by an adult. The visitor must pass COVID-19 screenings, use hand sanitizer when entering and exiting the patient's room and wear a mask at all times, including in the patient's room. Patients must also wear a mask when staff or their visitor are in the room. Masking is required regardless of vaccination status.

## 2020-06-21 ENCOUNTER — Other Ambulatory Visit
Admission: RE | Admit: 2020-06-21 | Discharge: 2020-06-21 | Disposition: A | Discharge status: Home / Self Care | Payer: BC Managed Care – PPO | Source: Ambulatory Visit | Attending: Surgery | Admitting: Surgery

## 2020-06-21 ENCOUNTER — Telehealth: Payer: Self-pay | Admitting: Urgent Care

## 2020-06-21 ENCOUNTER — Other Ambulatory Visit: Payer: Self-pay

## 2020-06-21 ENCOUNTER — Encounter: Payer: Self-pay | Admitting: Urgent Care

## 2020-06-21 DIAGNOSIS — Z20822 Contact with and (suspected) exposure to covid-19: Secondary | ICD-10-CM | POA: Diagnosis not present

## 2020-06-21 DIAGNOSIS — Z791 Long term (current) use of non-steroidal anti-inflammatories (NSAID): Secondary | ICD-10-CM | POA: Diagnosis not present

## 2020-06-21 DIAGNOSIS — Z79899 Other long term (current) drug therapy: Secondary | ICD-10-CM | POA: Diagnosis not present

## 2020-06-21 DIAGNOSIS — Z87891 Personal history of nicotine dependence: Secondary | ICD-10-CM | POA: Diagnosis not present

## 2020-06-21 DIAGNOSIS — K430 Incisional hernia with obstruction, without gangrene: Secondary | ICD-10-CM | POA: Diagnosis not present

## 2020-06-21 DIAGNOSIS — Z8249 Family history of ischemic heart disease and other diseases of the circulatory system: Secondary | ICD-10-CM | POA: Diagnosis not present

## 2020-06-21 DIAGNOSIS — Z01812 Encounter for preprocedural laboratory examination: Secondary | ICD-10-CM | POA: Insufficient documentation

## 2020-06-21 LAB — SARS CORONAVIRUS 2 (TAT 6-24 HRS): SARS Coronavirus 2: NEGATIVE

## 2020-06-21 NOTE — Progress Notes (Addendum)
  Perioperative Services Pre-Admission/Anesthesia Testing     Date: 06/21/20  Name: Brian Bradley MRN:   [id number]  Re: Request to quarantine following SARS-CoV-2 testing  [facility] was tested for SARS-CoV-2 (novel coronavirus) today in preparation for a surgical procedure that is scheduled for 06/23/2020.  We ask that he quarantines at home between now and the time of his procedure. This request is supported by [facility] and [place] DHHS recommendations, which are being implemented out of an abundance of caution to prevent transmission and potential spread during the current SARS-CoV-2 pandemic.  If patient were to develop any concerning symptoms, or feel any different than he felt at the time that his SARS-CoV-2 testing was performed, he should promptly notify his surgeon's office for further evaluation and directives.   In light of the outlines plan of care above, we ask that AEDON DEASON be excused from his normal job duties, allowing for him to properly quarantine at home. This again ensures that he will be ready to safely undergo his planned surgical procedure as referenced above. Post-operative leave may be required as well. Documentation of need for post-operative leave will come from his primary surgeon as deemed necessary. We thank you in advance for your cooperation with this matter. If there are any questions and/or concerns, please feel free to reach out to his here at the hospital.    Respectfully,      Quentin Mulling, MSN, APRN, FNP-C, CEN [facility]  Peri-operative Services Nurse Practitioner Phone: [phone] 06/21/20 11:51 AM

## 2020-06-23 ENCOUNTER — Ambulatory Visit
Admission: RE | Admit: 2020-06-23 | Discharge: 2020-06-23 | Disposition: A | Discharge status: Home / Self Care | Payer: BC Managed Care – PPO | Attending: Surgery | Admitting: Surgery

## 2020-06-23 ENCOUNTER — Encounter
Admission: RE | Disposition: A | Discharge status: Home / Self Care | Payer: Self-pay | Source: Home / Self Care | Attending: Surgery

## 2020-06-23 ENCOUNTER — Ambulatory Visit: Payer: BC Managed Care – PPO | Admitting: Urgent Care

## 2020-06-23 ENCOUNTER — Encounter: Payer: Self-pay | Admitting: Surgery

## 2020-06-23 ENCOUNTER — Other Ambulatory Visit: Payer: Self-pay

## 2020-06-23 ENCOUNTER — Ambulatory Visit: Payer: BC Managed Care – PPO | Admitting: Anesthesiology

## 2020-06-23 DIAGNOSIS — Z79899 Other long term (current) drug therapy: Secondary | ICD-10-CM | POA: Insufficient documentation

## 2020-06-23 DIAGNOSIS — K430 Incisional hernia with obstruction, without gangrene: Secondary | ICD-10-CM | POA: Insufficient documentation

## 2020-06-23 DIAGNOSIS — Z20822 Contact with and (suspected) exposure to covid-19: Secondary | ICD-10-CM | POA: Insufficient documentation

## 2020-06-23 DIAGNOSIS — Z791 Long term (current) use of non-steroidal anti-inflammatories (NSAID): Secondary | ICD-10-CM | POA: Insufficient documentation

## 2020-06-23 DIAGNOSIS — Z87891 Personal history of nicotine dependence: Secondary | ICD-10-CM | POA: Insufficient documentation

## 2020-06-23 DIAGNOSIS — Z8249 Family history of ischemic heart disease and other diseases of the circulatory system: Secondary | ICD-10-CM | POA: Insufficient documentation

## 2020-06-23 HISTORY — PX: VENTRAL HERNIA REPAIR: SHX424

## 2020-06-23 SURGERY — REPAIR, HERNIA, VENTRAL
Anesthesia: General

## 2020-06-23 MED ORDER — FENTANYL CITRATE (PF) 100 MCG/2ML IJ SOLN
INTRAMUSCULAR | Status: DC | PRN
  Administered 2020-06-23 (×2): 50 ug via INTRAVENOUS

## 2020-06-23 MED ORDER — MIDAZOLAM HCL 2 MG/2ML IJ SOLN
INTRAMUSCULAR | Status: DC | PRN
  Administered 2020-06-23: 2 mg via INTRAVENOUS

## 2020-06-23 MED ORDER — LIDOCAINE HCL (CARDIAC) PF 100 MG/5ML IV SOSY
PREFILLED_SYRINGE | INTRAVENOUS | Status: DC | PRN
  Administered 2020-06-23: 100 mg via INTRAVENOUS

## 2020-06-23 MED ORDER — ACETAMINOPHEN 325 MG PO TABS: 650.0000 mg | ORAL_TABLET | Freq: Three times a day (TID) | ORAL | 0 refills | Status: AC | PRN

## 2020-06-23 MED ORDER — PROPOFOL 500 MG/50ML IV EMUL
INTRAVENOUS | Status: AC
  Filled 2020-06-23: qty 50

## 2020-06-23 MED ORDER — BUPIVACAINE-MELOXICAM ER 200-6 MG/7ML IJ SOLN
INTRAMUSCULAR | Status: DC | PRN
  Administered 2020-06-23: 4 mL

## 2020-06-23 MED ORDER — PROPOFOL 10 MG/ML IV BOLUS
INTRAVENOUS | Status: AC
  Filled 2020-06-23: qty 20

## 2020-06-23 MED ORDER — OXYCODONE HCL 5 MG PO TABS
ORAL_TABLET | ORAL | Status: AC
  Filled 2020-06-23: qty 1

## 2020-06-23 MED ORDER — SUCCINYLCHOLINE CHLORIDE 20 MG/ML IJ SOLN
INTRAMUSCULAR | Status: DC | PRN
  Administered 2020-06-23: 120 mg via INTRAVENOUS

## 2020-06-23 MED ORDER — PROPOFOL 10 MG/ML IV BOLUS
INTRAVENOUS | Status: DC | PRN
  Administered 2020-06-23: 200 mg via INTRAVENOUS
  Administered 2020-06-23: 100 mg via INTRAVENOUS

## 2020-06-23 MED ORDER — MIDAZOLAM HCL 2 MG/2ML IJ SOLN
INTRAMUSCULAR | Status: AC
  Filled 2020-06-23: qty 2

## 2020-06-23 MED ORDER — EPHEDRINE 5 MG/ML INJ
INTRAVENOUS | Status: AC
  Filled 2020-06-23: qty 10

## 2020-06-23 MED ORDER — DEXAMETHASONE SODIUM PHOSPHATE 10 MG/ML IJ SOLN
INTRAMUSCULAR | Status: DC | PRN
  Administered 2020-06-23: 10 mg via INTRAVENOUS

## 2020-06-23 MED ORDER — PHENYLEPHRINE HCL (PRESSORS) 10 MG/ML IV SOLN
INTRAVENOUS | Status: DC | PRN
  Administered 2020-06-23: 50 ug via INTRAVENOUS
  Administered 2020-06-23 (×2): 100 ug via INTRAVENOUS
  Administered 2020-06-23: 50 ug via INTRAVENOUS

## 2020-06-23 MED ORDER — LACTATED RINGERS IV SOLN: INTRAVENOUS | Status: DC

## 2020-06-23 MED ORDER — KETOROLAC TROMETHAMINE 30 MG/ML IJ SOLN
INTRAMUSCULAR | Status: AC
  Filled 2020-06-23: qty 1

## 2020-06-23 MED ORDER — ROCURONIUM BROMIDE 100 MG/10ML IV SOLN
INTRAVENOUS | Status: DC | PRN
  Administered 2020-06-23: 10 mg via INTRAVENOUS
  Administered 2020-06-23: 20 mg via INTRAVENOUS
  Administered 2020-06-23: 10 mg via INTRAVENOUS
  Administered 2020-06-23: 20 mg via INTRAVENOUS

## 2020-06-23 MED ORDER — ORAL CARE MOUTH RINSE: 15.0000 mL | Freq: Once | OROMUCOSAL | Status: AC

## 2020-06-23 MED ORDER — OXYCODONE HCL 5 MG PO TABS
5.0000 mg | ORAL_TABLET | Freq: Once | ORAL | Status: AC | PRN
  Administered 2020-06-23: 5 mg via ORAL

## 2020-06-23 MED ORDER — ACETAMINOPHEN 500 MG PO TABS: 1000.0000 mg | ORAL_TABLET | ORAL | Status: AC

## 2020-06-23 MED ORDER — ONDANSETRON HCL 4 MG/2ML IJ SOLN: 4.0000 mg | Freq: Once | INTRAMUSCULAR | Status: DC | PRN

## 2020-06-23 MED ORDER — CEFAZOLIN SODIUM-DEXTROSE 2-4 GM/100ML-% IV SOLN
INTRAVENOUS | Status: AC
  Filled 2020-06-23: qty 100

## 2020-06-23 MED ORDER — DEXMEDETOMIDINE (PRECEDEX) IN NS 20 MCG/5ML (4 MCG/ML) IV SYRINGE
PREFILLED_SYRINGE | INTRAVENOUS | Status: DC | PRN
  Administered 2020-06-23: 8 ug via INTRAVENOUS
  Administered 2020-06-23: 12 ug via INTRAVENOUS

## 2020-06-23 MED ORDER — ONDANSETRON HCL 4 MG/2ML IJ SOLN
INTRAMUSCULAR | Status: DC | PRN
  Administered 2020-06-23: 4 mg via INTRAVENOUS

## 2020-06-23 MED ORDER — ACETAMINOPHEN 500 MG PO TABS
ORAL_TABLET | ORAL | Status: AC
  Administered 2020-06-23: 1000 mg via ORAL
  Filled 2020-06-23: qty 2

## 2020-06-23 MED ORDER — CHLORHEXIDINE GLUCONATE CLOTH 2 % EX PADS
6.0000 | MEDICATED_PAD | Freq: Once | CUTANEOUS | Status: AC
  Administered 2020-06-23: 6 via TOPICAL

## 2020-06-23 MED ORDER — HYDROCODONE-ACETAMINOPHEN 5-325 MG PO TABS: 1.0000 | ORAL_TABLET | Freq: Four times a day (QID) | ORAL | 0 refills | Status: DC | PRN

## 2020-06-23 MED ORDER — OXYCODONE HCL 5 MG/5ML PO SOLN
5.0000 mg | Freq: Once | ORAL | Status: AC | PRN
Start: 2020-06-23 — End: 2020-06-23

## 2020-06-23 MED ORDER — CEFAZOLIN SODIUM-DEXTROSE 2-4 GM/100ML-% IV SOLN
2.0000 g | INTRAVENOUS | Status: AC
  Administered 2020-06-23: 2 g via INTRAVENOUS

## 2020-06-23 MED ORDER — DOCUSATE SODIUM 100 MG PO CAPS: 100.0000 mg | ORAL_CAPSULE | Freq: Two times a day (BID) | ORAL | 0 refills | Status: AC | PRN

## 2020-06-23 MED ORDER — FAMOTIDINE 20 MG PO TABS: 20.0000 mg | ORAL_TABLET | Freq: Once | ORAL | Status: AC

## 2020-06-23 MED ORDER — BUPIVACAINE HCL (PF) 0.5 % IJ SOLN
INTRAMUSCULAR | Status: AC
  Filled 2020-06-23: qty 30

## 2020-06-23 MED ORDER — FENTANYL CITRATE (PF) 100 MCG/2ML IJ SOLN
25.0000 ug | INTRAMUSCULAR | Status: DC | PRN
  Administered 2020-06-23 (×2): 25 ug via INTRAVENOUS
  Administered 2020-06-23: 50 ug via INTRAVENOUS

## 2020-06-23 MED ORDER — FENTANYL CITRATE (PF) 100 MCG/2ML IJ SOLN
INTRAMUSCULAR | Status: AC
  Filled 2020-06-23: qty 2

## 2020-06-23 MED ORDER — GLYCOPYRROLATE 0.2 MG/ML IJ SOLN
INTRAMUSCULAR | Status: DC | PRN
  Administered 2020-06-23: .2 mg via INTRAVENOUS

## 2020-06-23 MED ORDER — BUPIVACAINE-MELOXICAM ER 200-6 MG/7ML IJ SOLN
INTRAMUSCULAR | Status: AC
  Filled 2020-06-23: qty 1

## 2020-06-23 MED ORDER — FAMOTIDINE 20 MG PO TABS
ORAL_TABLET | ORAL | Status: AC
  Administered 2020-06-23: 20 mg via ORAL
  Filled 2020-06-23: qty 1

## 2020-06-23 MED ORDER — SUGAMMADEX SODIUM 500 MG/5ML IV SOLN
INTRAVENOUS | Status: DC | PRN
  Administered 2020-06-23: 250 mg via INTRAVENOUS

## 2020-06-23 MED ORDER — CHLORHEXIDINE GLUCONATE 0.12 % MT SOLN: 15.0000 mL | Freq: Once | OROMUCOSAL | Status: AC

## 2020-06-23 MED ORDER — CHLORHEXIDINE GLUCONATE 0.12 % MT SOLN
OROMUCOSAL | Status: AC
  Administered 2020-06-23: 15 mL via OROMUCOSAL
  Filled 2020-06-23: qty 15

## 2020-06-23 SURGICAL SUPPLY — 32 items
ADH SKN CLS APL DERMABOND .7 (GAUZE/BANDAGES/DRESSINGS) ×1
APL PRP STRL LF DISP 70% ISPRP (MISCELLANEOUS) ×1
BLADE SURG 15 STRL LF DISP TIS (BLADE) ×1 IMPLANT
BLADE SURG 15 STRL SS (BLADE) ×2
CANISTER SUCT 1200ML W/VALVE (MISCELLANEOUS) ×2 IMPLANT
CHLORAPREP W/TINT 26 (MISCELLANEOUS) ×2 IMPLANT
COVER WAND RF STERILE (DRAPES) ×2 IMPLANT
DERMABOND ADVANCED (GAUZE/BANDAGES/DRESSINGS) ×1
DERMABOND ADVANCED .7 DNX12 (GAUZE/BANDAGES/DRESSINGS) ×1 IMPLANT
DRAPE LAPAROTOMY 100X77 ABD (DRAPES) ×2 IMPLANT
ELECT CAUTERY BLADE 6.4 (BLADE) ×2 IMPLANT
ELECT REM PT RETURN 9FT ADLT (ELECTROSURGICAL) ×2
ELECTRODE REM PT RTRN 9FT ADLT (ELECTROSURGICAL) ×1 IMPLANT
GLOVE SURG SYN 6.5 ES PF (GLOVE) ×6 IMPLANT
GLOVE SURG UNDER POLY LF SZ7 (GLOVE) ×6 IMPLANT
GOWN STRL REUS W/ TWL LRG LVL3 (GOWN DISPOSABLE) ×3 IMPLANT
GOWN STRL REUS W/TWL LRG LVL3 (GOWN DISPOSABLE) ×6
KIT TURNOVER KIT A (KITS) ×2 IMPLANT
LABEL OR SOLS (LABEL) ×2 IMPLANT
MANIFOLD NEPTUNE II (INSTRUMENTS) ×2 IMPLANT
NEEDLE HYPO 22GX1.5 SAFETY (NEEDLE) ×4 IMPLANT
NS IRRIG 500ML POUR BTL (IV SOLUTION) ×2 IMPLANT
PACK BASIN MINOR ARMC (MISCELLANEOUS) ×2 IMPLANT
SUT ETHIBOND NAB MO 7 #0 18IN (SUTURE) ×2 IMPLANT
SUT MNCRL 4-0 (SUTURE) ×2
SUT MNCRL 4-0 27XMFL (SUTURE) ×1
SUT VIC AB 3-0 SH 27 (SUTURE) ×4
SUT VIC AB 3-0 SH 27X BRD (SUTURE) ×2 IMPLANT
SUTURE MNCRL 4-0 27XMF (SUTURE) ×1 IMPLANT
SYR 10ML LL (SYRINGE) ×4 IMPLANT
SYR 20ML LL LF (SYRINGE) ×2 IMPLANT
WATER STERILE IRR 1000ML POUR (IV SOLUTION) ×2 IMPLANT

## 2020-06-23 NOTE — Interval H&P Note (Signed)
History and Physical Interval Note:  06/23/2020 12:13 PM  Brian Bradley  has presented today for surgery, with the diagnosis of Incisional hernia, without obstruction or gangrene K43.2.  The various methods of treatment have been discussed with the patient and family. After consideration of risks, benefits and other options for treatment, the patient has consented to  Procedure(s): HERNIA REPAIR INCISIONAL ADULT (N/A) as a surgical intervention.  The patient's history has been reviewed, patient examined, no change in status, stable for surgery.  I have reviewed the patient's chart and labs.  Questions were answered to the patient's satisfaction.     Krystopher Kuenzel Tonna Boehringer

## 2020-06-23 NOTE — Op Note (Signed)
Preoperative diagnosis: incisional hernia, incarcerated Postoperative diagnosis: same x2  Procedure:  Open incisional hernia repair x2 Anesthesia: LMA  Surgeon: Sung Amabile  Wound Classification: Clean  Specimen: none  Complications: None  Estimated Blood Loss: minimal  Indications:see HPI  Findings: 1. 0.5cm x 0.5cm incarcerated incisional hernia x1, adjacent, but distinctly separate 0.3 cm x 0.3cm incisional hernia x1 2. Tension free repair achieved with suture 3. Adequate hemostasis  Description of procedure: The patient was brought to the operating room and general anesthesia was induced. A time-out was completed verifying correct patient, procedure, site, positioning, and implant(s) and/or special equipment prior to beginning this procedure. Antibiotics were administered prior to making the incision. SCDs placed. The anterior abdominal wall was prepped and draped in the standard sterile fashion.   A supraumbilical incision was made, dissection carried down to fascia where incarcerated pre-peritoneal fat contents were noted.  This was dissected off surrounding tissue and eventually reduced throught a 0.5 cm x 0.5cm incisional hernia, likely port site from previous lap chole.  Fascia noted to be extremely thin, so incision extended to ensure no additional defects.  One additional defect noted inferior to the initially noted hernia, with incarcerate contents as well.  This was dissected free and reduced.  0.3cm x 0.3cm hernia noted.   The defects primary closed using 0 Ethibond in an interrupted fashion.  The fascia as well as the skin incision was then infused with Zynrelef.  Wound irrigated and closed in a multilayer fashion, using 3-0 Vicryl for the deep dermal layer in an interrupted fashion and running 4-0 Monocryl in a subcuticular fashion.  Wound was then dressed with Dermabond.  Patient was then successfully awakened and transferred to PACU in stable condition.  At the end of  the procedure sponge and instrument counts were correct

## 2020-06-23 NOTE — Discharge Instructions (Signed)
Hernia repair, Care After This sheet gives you information about how to care for yourself after your procedure. Your health care provider may also give you more specific instructions. If you have problems or questions, contact your health care provider. What can I expect after the procedure? After your procedure, it is common to have the following:  Pain in your abdomen, especially in the incision areas. You will be given medicine to control the pain.  Tiredness. This is a normal part of the recovery process. Your energy level will return to normal over the next several weeks.  Changes in your bowel movements, such as constipation or needing to go more often. Talk with your health care provider about how to manage this. Follow these instructions at home: Medicines   tylenol and advil as needed for discomfort.  Please alternate between the two every four hours as needed for pain.     Use narcotics, if prescribed, only when tylenol and motrin is not enough to control pain.   325-650mg  every 8hrs to max of 3000mg /24hrs (including the 325mg  in every norco dose) for the tylenol.     Advil up to 800mg  per dose every 8hrs as needed for pain.    PLEASE RECORD NUMBER OF PILLS TAKEN UNTIL NEXT FOLLOW UP APPT.  THIS WILL HELP DETERMINE HOW READY YOU ARE TO BE RELEASED FROM ANY ACTIVITY RESTRICTIONS  Do not drive or use heavy machinery while taking prescription pain medicine.  Do not drink alcohol while taking prescription pain medicine.  Incision care     Follow instructions from your health care provider about how to take care of your incision areas. Make sure you: ? Keep your incisions clean and dry. ? Wash your hands with soap and water before and after applying medicine to the areas, and before and after changing your bandage (dressing). If soap and water are not available, use hand sanitizer. ? Change your dressing as told by your health care provider. ? Leave stitches (sutures), skin  glue, or adhesive strips in place. These skin closures may need to stay in place for 2 weeks or longer. If adhesive strip edges start to loosen and curl up, you may trim the loose edges. Do not remove adhesive strips completely unless your health care provider tells you to do that.  Do not wear tight clothing over the incisions. Tight clothing may rub and irritate the incision areas, which may cause the incisions to open.  Do not take baths, swim, or use a hot tub until your health care provider approves. OK TO SHOWER IN 24HRS.    Check your incision area every day for signs of infection. Check for: ? More redness, swelling, or pain. ? More fluid or blood. ? Warmth. ? Pus or a bad smell. Activity  Avoid lifting anything that is heavier than 10 lb (4.5 kg) for 2 weeks or until your health care provider says it is okay.  No pushing/pulling greater than 30lbs  You may resume normal activities as told by your health care provider. Ask your health care provider what activities are safe for you.  Take rest breaks during the day as needed. Eating and drinking  Follow instructions from your health care provider about what you can eat after surgery.  To prevent or treat constipation while you are taking prescription pain medicine, your health care provider may recommend that you: ? Drink enough fluid to keep your urine clear or pale yellow. ? Take over-the-counter or prescription medicines. ?  Eat foods that are high in fiber, such as fresh fruits and vegetables, whole grains, and beans. ? Limit foods that are high in fat and processed sugars, such as fried and sweet foods. General instructions  Ask your health care provider when you will need an appointment to get your sutures or staples removed.  Keep all follow-up visits as told by your health care provider. This is important. Contact a health care provider if:  You have more redness, swelling, or pain around your incisions.  You have  more fluid or blood coming from the incisions.  Your incisions feel warm to the touch.  You have pus or a bad smell coming from your incisions or your dressing.  You have a fever.  You have an incision that breaks open (edges not staying together) after sutures or staples have been removed. Get help right away if:  You develop a rash.  You have chest pain or difficulty breathing.  You have pain or swelling in your legs.  You feel light-headed or you faint.  Your abdomen swells (becomes distended).  You have nausea or vomiting.  You have blood in your stool (feces). This information is not intended to replace advice given to you by your health care provider. Make sure you discuss any questions you have with your health care provider. Document Released: 10/12/2004 Document Revised: 12/12/2017 Document Reviewed: 12/25/2015 Elsevier Interactive Patient Education  2019 [employer]   AMBULATORY SURGERY  DISCHARGE INSTRUCTIONS   1) The drugs that you were given will stay in your system until tomorrow so for the next 24 hours you should not:  A) Drive an automobile B) Make any legal decisions C) Drink any alcoholic beverage   2) You may resume regular meals tomorrow.  Today it is better to start with liquids and gradually work up to solid foods.  You may eat anything you prefer, but it is better to start with liquids, then soup and crackers, and gradually work up to solid foods.   3) Please notify your doctor immediately if you have any unusual bleeding, trouble breathing, redness and pain at the surgery site, drainage, fever, or pain not relieved by medication.    Additional Instructions:  N/A       Please contact your physician with any problems or Same Day Surgery at [phone], Monday through Friday 6 am to 4 pm, or [place] at [facility] number at [phone].

## 2020-06-23 NOTE — Anesthesia Procedure Notes (Addendum)
Procedure Name: Intubation Date/Time: 06/23/2020 3:40 PM Performed by: Joanette Gula, Summer, CRNA Pre-anesthesia Checklist: Patient identified, Emergency Drugs available, Suction available and Patient being monitored Patient Re-evaluated:Patient Re-evaluated prior to induction Oxygen Delivery Method: Circle system utilized Preoxygenation: Pre-oxygenation with 100% oxygen Induction Type: IV induction Ventilation: Mask ventilation without difficulty and Oral airway inserted - appropriate to patient size Laryngoscope Size: McGraph and 4 Grade View: Grade I Tube type: Oral Tube size: 7.5 mm Number of attempts: 1 Airway Equipment and Method: Stylet and Oral airway Placement Confirmation: ETT inserted through vocal cords under direct vision,  positive ETCO2 and breath sounds checked- equal and bilateral Secured at: 23 cm Tube secured with: Tape Dental Injury: Teeth and Oropharynx as per pre-operative assessment

## 2020-06-23 NOTE — Transfer of Care (Signed)
Immediate Anesthesia Transfer of Care Note  Patient: Brian Bradley [facility]  Procedure(s) Performed: HERNIA REPAIR INCISIONAL ADULT (N/A )  Patient Location: PACU  Anesthesia Type:General  Level of Consciousness: drowsy  Airway & Oxygen Therapy: Patient Spontanous Breathing and Patient connected to face mask oxygen  Post-op Assessment: Report given to RN and Post -op Vital signs reviewed and stable  Post vital signs: Reviewed and stable  Last Vitals:  Vitals Value Taken Time  BP 101/60 06/23/20 1634  Temp 36.9 C 06/23/20 1638  Pulse 79 06/23/20 1638  Resp 20 06/23/20 1638  SpO2 99 % 06/23/20 1638  Vitals shown include unvalidated device data.  Last Pain:  Vitals:   06/23/20 1156  TempSrc: Temporal  PainSc: 0-No pain         Complications: No complications documented.

## 2020-06-23 NOTE — Anesthesia Preprocedure Evaluation (Signed)
Anesthesia Evaluation  Patient identified by MRN, date of birth, ID band Patient awake    Reviewed: Allergy & Precautions, NPO status , Patient's Chart, lab work & pertinent test results  History of Anesthesia Complications Negative for: history of anesthetic complications  Airway Mallampati: II  TM Distance: >3 FB Neck ROM: Full    Dental no notable dental hx. (+) Teeth Intact   Pulmonary neg pulmonary ROS, neg sleep apnea, neg COPD, Patient abstained from smoking.Not current smoker, former smoker,    Pulmonary exam normal breath sounds clear to auscultation       Cardiovascular Exercise Tolerance: Good METS(-) hypertension(-) CAD and (-) Past MI negative cardio ROS  (-) dysrhythmias  Rhythm:Regular Rate:Normal - Systolic murmurs    Neuro/Psych  Headaches, PSYCHIATRIC DISORDERS Anxiety  Neuromuscular disease    GI/Hepatic GERD  Controlled,(+)     (-) substance abuse  ,   Endo/Other  neg diabetes  Renal/GU negative Renal ROS     Musculoskeletal   Abdominal   Peds  Hematology   Anesthesia Other Findings Past Medical History: No date: Anxiety     Comment:  NO MEDS No date: GERD (gastroesophageal reflux disease)     Comment:  OCC NO MEDS No date: Headache No date: History of kidney stones  Reproductive/Obstetrics                             Anesthesia Physical Anesthesia Plan  ASA: II  Anesthesia Plan: General   Post-op Pain Management:    Induction: Intravenous  PONV Risk Score and Plan: 3 and Ondansetron and Dexamethasone  Airway Management Planned: Oral ETT  Additional Equipment: None  Intra-op Plan:   Post-operative Plan: Extubation in OR  Informed Consent: I have reviewed the patients History and Physical, chart, labs and discussed the procedure including the risks, benefits and alternatives for the proposed anesthesia with the patient or authorized representative  who has indicated his/her understanding and acceptance.     Dental advisory given  Plan Discussed with: CRNA and Surgeon  Anesthesia Plan Comments: (Discussed risks of anesthesia with patient, including PONV, sore throat, lip/dental damage. Rare risks discussed as well, such as cardiorespiratory and neurological sequelae. Patient understands.)        Anesthesia Quick Evaluation

## 2020-06-24 NOTE — Anesthesia Postprocedure Evaluation (Signed)
Anesthesia Post Note  Patient: Brian Bradley [facility]  Procedure(s) Performed: HERNIA REPAIR INCISIONAL ADULT (N/A )  Patient location during evaluation: PACU Anesthesia Type: General Level of consciousness: awake and alert and oriented Pain management: pain level controlled Vital Signs Assessment: post-procedure vital signs reviewed and stable Respiratory status: spontaneous breathing Cardiovascular status: blood pressure returned to baseline Anesthetic complications: no   No complications documented.   Last Vitals:  Vitals:   06/23/20 1700 06/23/20 1722  BP: 112/69 126/67  Pulse: 79   Resp: 13 16  Temp:  36.6 C  SpO2: 96% 94%    Last Pain:  Vitals:   06/23/20 1722  TempSrc: Tympanic  PainSc:                  [name]

## 2020-06-25 ENCOUNTER — Encounter: Payer: Self-pay | Admitting: Surgery

## 2021-01-13 ENCOUNTER — Other Ambulatory Visit: Payer: Self-pay

## 2021-01-13 ENCOUNTER — Emergency Department: Payer: BC Managed Care – PPO

## 2021-01-13 ENCOUNTER — Encounter: Payer: Self-pay | Admitting: Intensive Care

## 2021-01-13 ENCOUNTER — Emergency Department
Admission: EM | Admit: 2021-01-13 | Discharge: 2021-01-13 | Disposition: A | Discharge status: Home / Self Care | Payer: BC Managed Care – PPO | Attending: Emergency Medicine | Admitting: Emergency Medicine

## 2021-01-13 DIAGNOSIS — N200 Calculus of kidney: Secondary | ICD-10-CM | POA: Insufficient documentation

## 2021-01-13 DIAGNOSIS — Z87891 Personal history of nicotine dependence: Secondary | ICD-10-CM | POA: Insufficient documentation

## 2021-01-13 DIAGNOSIS — R1031 Right lower quadrant pain: Secondary | ICD-10-CM | POA: Diagnosis present

## 2021-01-13 HISTORY — DX: Calculus of kidney: N20.0

## 2021-01-13 HISTORY — DX: Pure hypercholesterolemia, unspecified: E78.00

## 2021-01-13 LAB — CBC
HCT: 44.4 % (ref 39.0–52.0)
Hemoglobin: 15.6 g/dL (ref 13.0–17.0)
MCH: 30.8 pg (ref 26.0–34.0)
MCHC: 35.1 g/dL (ref 30.0–36.0)
MCV: 87.7 fL (ref 80.0–100.0)
Platelets: 200 10*3/uL (ref 150–400)
RBC: 5.06 MIL/uL (ref 4.22–5.81)
RDW: 13.5 % (ref 11.5–15.5)
WBC: 6.7 10*3/uL (ref 4.0–10.5)
nRBC: 0 % (ref 0.0–0.2)

## 2021-01-13 LAB — BASIC METABOLIC PANEL
Anion gap: 10 (ref 5–15)
BUN: 18 mg/dL (ref 6–20)
CO2: 19 mmol/L — ABNORMAL LOW (ref 22–32)
Calcium: 9.1 mg/dL (ref 8.9–10.3)
Chloride: 107 mmol/L (ref 98–111)
Creatinine, Ser: 1.11 mg/dL (ref 0.61–1.24)
GFR, Estimated: >60 mL/min (ref >60)
Glucose, Bld: 110 mg/dL — ABNORMAL HIGH (ref 70–99)
Potassium: 3.9 mmol/L (ref 3.5–5.1)
Sodium: 136 mmol/L (ref 135–145)

## 2021-01-13 MED ORDER — SODIUM CHLORIDE 0.9 % IV BOLUS
1000.0000 mL | Freq: Once | INTRAVENOUS | Status: AC
  Administered 2021-01-13: 1000 mL via INTRAVENOUS

## 2021-01-13 MED ORDER — HYDROCODONE-ACETAMINOPHEN 5-325 MG PO TABS: 1.0000 | ORAL_TABLET | ORAL | 0 refills | Status: AC | PRN

## 2021-01-13 MED ORDER — HYDROMORPHONE HCL 1 MG/ML IJ SOLN
1.0000 mg | Freq: Once | INTRAMUSCULAR | Status: AC
  Administered 2021-01-13: 1 mg via INTRAVENOUS
  Filled 2021-01-13: qty 1

## 2021-01-13 MED ORDER — IBUPROFEN 600 MG PO TABS: 600.0000 mg | ORAL_TABLET | Freq: Four times a day (QID) | ORAL | 0 refills | Status: DC | PRN

## 2021-01-13 MED ORDER — KETOROLAC TROMETHAMINE 30 MG/ML IJ SOLN
30.0000 mg | Freq: Once | INTRAMUSCULAR | Status: AC
  Administered 2021-01-13: 30 mg via INTRAVENOUS
  Filled 2021-01-13: qty 1

## 2021-01-13 NOTE — ED Provider Notes (Signed)
[facility] Emergency Department Provider Note ____________________________________________   Event Date/Time   First MD Initiated Contact with Patient 01/13/21 1254     (approximate)  I have reviewed the triage vital signs and the nursing notes.   HISTORY  Chief Complaint Flank Pain    HPI Brian Bradley is a 45 y.o. male with PMH as noted below who presents with right flank pain, acute onset around noon today, persistent course since then, radiating to the right lower quadrant, and associated with nausea and vomiting.  It is severe in intensity.  He denies associated urinary symptoms.  He has had kidney stones in the past but states the pain was never this bad.  Past Medical History:  Diagnosis Date   Anxiety    NO MEDS   GERD (gastroesophageal reflux disease)    OCC NO MEDS   Headache    High cholesterol    History of kidney stones    Kidney stones     Patient Active Problem List   Diagnosis Date Noted   Herniation of cervical intervertebral disc with radiculopathy 12/02/2017    Past Surgical History:  Procedure Laterality Date   BACK SURGERY  2019   NECK C7   CHOLECYSTECTOMY     VENTRAL HERNIA REPAIR N/A 06/23/2020   Procedure: HERNIA REPAIR INCISIONAL ADULT;  Surgeon: Sung Amabile, DO;  Location: [facility] ORS;  Service: General;  Laterality: N/A;    Prior to Admission medications   Medication Sig Start Date End Date Taking? Authorizing Provider  HYDROcodone-acetaminophen (NORCO/VICODIN) 5-325 MG tablet Take 1 tablet by mouth every 4 (four) hours as needed for up to 3 days for severe pain. 01/13/21 01/16/21 Yes Dionne Bucy, MD  ibuprofen (ADVIL) 600 MG tablet Take 1 tablet (600 mg total) by mouth every 6 (six) hours as needed. 01/13/21  Yes Dionne Bucy, MD  meloxicam (MOBIC) 15 MG tablet Take 15 mg by mouth daily.    [provider]  methocarbamol (ROBAXIN) 500 MG tablet Take 500 mg by mouth in the morning and at  bedtime.    [provider]  SUMAtriptan (IMITREX) 100 MG tablet Take 100 mg by mouth every 2 (two) hours as needed for migraine.    [provider]    Allergies Patient has no known allergies.  History reviewed. No pertinent family history.  Social History Social History   Tobacco Use   Smoking status: Former    Packs/day: 1.00    Years: 13.00    Pack years: 13.00    Types: Cigarettes    Quit date: 08/01/2005    Years since quitting: 15.4   Smokeless tobacco: Former  Building services engineer Use: Some days   Substances: CBD  Substance Use Topics   Alcohol use: Yes    Comment: rarely   Drug use: Never    Review of Systems  Constitutional: No fever/chills Eyes: No visual changes. ENT: No sore throat. Cardiovascular: Denies chest pain. Respiratory: Denies shortness of breath. Gastrointestinal: Positive for nausea and vomiting. Genitourinary: Negative for dysuria.  Positive for flank pain. Musculoskeletal: Negative for back pain. Skin: Negative for rash. Neurological: Negative for headaches, focal weakness or numbness.   ____________________________________________   PHYSICAL EXAM:  VITAL SIGNS: ED Triage Vitals  Enc Vitals Group     BP 01/13/21 1240 138/87     Pulse Rate 01/13/21 1240 78     Resp 01/13/21 1240 (!) 22     Temp 01/13/21 1240 97.7 F (  36.5 C)     Temp Source 01/13/21 1240 Oral     SpO2 01/13/21 1240 100 %     Weight 01/13/21 1242 230 lb (104.3 kg)     Height 01/13/21 1242 6\' 2"  (1.88 m)     Head Circumference --      Peak Flow --      Pain Score 01/13/21 1242 10     Pain Loc --      Pain Edu? --      Excl. in GC? --     Constitutional: Alert and oriented.  Uncomfortable appearing, in some distress.   Eyes: Conjunctivae are normal.  Head: Atraumatic. Nose: No congestion/rhinnorhea. Mouth/Throat: Mucous membranes are moist.   Neck: Normal range of motion.  Cardiovascular: Normal rate, regular rhythm. Good peripheral  circulation. Respiratory: Normal respiratory effort.  No retractions.  Gastrointestinal: Soft with mild right lower quadrant and flank tenderness.  No distention.  Genitourinary: No CVA tenderness. Musculoskeletal: Extremities warm and well perfused.  Neurologic:  Normal speech and language. No gross focal neurologic deficits are appreciated.  Skin:  Skin is warm and dry. No rash noted. Psychiatric: Mood and affect are normal. Speech and behavior are normal.  ____________________________________________   LABS (all labs ordered are listed, but only abnormal results are displayed)  Labs Reviewed  BASIC METABOLIC PANEL - Abnormal; Notable for the following components:      Result Value   CO2 19 (*)    Glucose, Bld 110 (*)    All other components within normal limits  CBC  URINALYSIS, COMPLETE (UACMP) WITH MICROSCOPIC   ____________________________________________  EKG   ____________________________________________  RADIOLOGY  CT abdomen/pelvis:  IMPRESSION:  3 mm calculus within the urinary bladder in the right trigone  region, causing mild right hydroureter and minimal right  hydronephrosis.    ____________________________________________   PROCEDURES  Procedure(s) performed: No  Procedures  Critical Care performed: No ____________________________________________   INITIAL IMPRESSION / ASSESSMENT AND PLAN / ED COURSE  Pertinent labs & imaging results that were available during my care of the patient were reviewed by me and considered in my medical decision making (see chart for details).   45 year old male with PMH as noted above presents with acute onset of right flank pain this afternoon associated with nausea and vomiting.  On exam, the patient is very uncomfortable appearing and tearful.  His vital signs are normal.  Exam is otherwise as described above.  Differential includes ureteral stone, versus less likely appendicitis, colitis, musculoskeletal  pain, pyelonephritis.  We will obtain lab work-up and a CT for further evaluation.  ----------------------------------------- 3:16 PM on 01/13/2021 -----------------------------------------  Labs are unremarkable.  CT shows a 3 mm stone in the bladder with mild right hydronephrosis and hydroureter.  This is consistent with a recently passed stone.  The patient had significant relief of his pain prior to the CT and after getting Dilaudid and Toradol.  At this time, the patient is stable for discharge home.  Although I had ordered a urinalysis, the patient has no dysuria, fever, leukocytosis, or other evidence of pyelonephritis.  At this time, the urinalysis would not change management.  I counseled the patient on the results of the work-up.  I will prescribe a small quantity of pain medication for home in case he has some continued pain.  Return precautions given, and he expresses understanding.  ____________________________________________   FINAL CLINICAL IMPRESSION(S) / ED DIAGNOSES  Final diagnoses:  Kidney stone      NEW  MEDICATIONS STARTED DURING THIS VISIT:  New Prescriptions   HYDROCODONE-ACETAMINOPHEN (NORCO/VICODIN) 5-325 MG TABLET    Take 1 tablet by mouth every 4 (four) hours as needed for up to 3 days for severe pain.   IBUPROFEN (ADVIL) 600 MG TABLET    Take 1 tablet (600 mg total) by mouth every 6 (six) hours as needed.     Note:  This document was prepared using Dragon voice recognition software and may include unintentional dictation errors.    Dionne Bucy, MD 01/13/21 [phone]

## 2021-01-13 NOTE — ED Notes (Signed)
Patient given discharge instructions, all questions answered. Patient in possession of all belongings, directed to the discharge area  

## 2021-01-13 NOTE — Discharge Instructions (Addendum)
Your kidney stone appears to have already passed into your bladder, so you should not have any significant pain at this point.  Return to the ER for new, worsening, or persistent severe pain, difficulty urinating, vomiting, fever, weakness, or any other new or worsening symptoms that concern you.

## 2021-01-13 NOTE — ED Triage Notes (Signed)
Patient presents with right sided flank pain that started around 12pm. Labored breathing and clenching stomach

## 2022-07-08 ENCOUNTER — Encounter: Payer: Self-pay | Admitting: Urology

## 2022-07-08 ENCOUNTER — Ambulatory Visit: Payer: BC Managed Care – PPO | Admitting: Urology

## 2022-07-08 VITALS — BP 118/76 | HR 72 | Ht 74.0 in | Wt 220.0 lb

## 2022-07-08 DIAGNOSIS — Z3009 Encounter for other general counseling and advice on contraception: Secondary | ICD-10-CM

## 2022-07-08 NOTE — Progress Notes (Signed)
07/08/2022 1:27 PM   Brian Bradley [birth year] [id number]  Referring provider: Idelle Crouch, MD [facility] [place],  [place] [address]  Chief Complaint  Patient presents with   VAS Consult    HPI: Brian Bradley is a 47 y.o. male who presents for vasectomy counseling.  Married with 2 children; ages 46 and 22 Denies prior history urologic problems including chronic scrotal pain, epididymitis or orchitis No previous history inguinal hernia or pelvic surgery No history of bleeding or clotting disorders   PMH: Past Medical History:  Diagnosis Date   Anxiety    NO MEDS   GERD (gastroesophageal reflux disease)    OCC NO MEDS   Headache    High cholesterol    History of kidney stones    Kidney stones     Surgical History: Past Surgical History:  Procedure Laterality Date   BACK SURGERY  2019   NECK C7   CHOLECYSTECTOMY     VENTRAL HERNIA REPAIR N/A 06/23/2020   Procedure: HERNIA REPAIR INCISIONAL ADULT;  Surgeon: Benjamine Sprague, DO;  Location: [facility] ORS;  Service: General;  Laterality: N/A;    Home Medications:  Allergies as of 07/08/2022       Reactions   Prednisone Other (See Comments)   Intraocular pressure        Medication List        Accurate as of July 08, 2022  1:27 PM. If you have any questions, ask your nurse or doctor.          ibuprofen 600 MG tablet Commonly known as: ADVIL Take 1 tablet (600 mg total) by mouth every 6 (six) hours as needed.   meloxicam 15 MG tablet Commonly known as: MOBIC Take 15 mg by mouth daily.   methocarbamol 500 MG tablet Commonly known as: ROBAXIN Take 500 mg by mouth in the morning and at bedtime.   SUMAtriptan 100 MG tablet Commonly known as: IMITREX Take 100 mg by mouth every 2 (two) hours as needed for migraine.        Allergies:  Allergies  Allergen Reactions   Prednisone Other (See Comments)    Intraocular pressure    Family History: No family history  on file.  Social History:  reports that he quit smoking about 16 years ago. His smoking use included cigarettes. He has a 13.00 pack-year smoking history. He has quit using smokeless tobacco. He reports current alcohol use. He reports that he does not use drugs.   Physical Exam: BP 118/76   Pulse 72   Ht 6\' 2"  (1.88 m)   Wt 220 lb (99.8 kg)   BMI 28.25 kg/m   Constitutional:  Alert and oriented, No acute distress. HEENT: [place] AT Respiratory: Normal respiratory effort, no increased work of breathing. GI: Abdomen is soft, nontender, nondistended, no abdominal masses GU: Phallus without lesions, testes descended bilaterally without masses or tenderness, spermatic cord/epididymis palpably normal bilaterally.  Vasa palpable bilaterally Skin: No rashes, bruises or suspicious lesions. Psychiatric: Normal mood and affect.   Assessment & Plan:    1.  Undesired fertility Desires to schedule vasectomy We had a long discussion about vasectomy. We specifically discussed the procedure, recovery and the risks, benefits and alternatives of vasectomy. I explained that the procedure entails removal of a segment of each vas deferens, each of which conducts sperm, and that the purpose of this procedure is to cause sterility (inability to produce children or cause pregnancy). Vasectomy is intended  to be permanent and irreversible form of contraception. Options for fertility after vasectomy include vasectomy reversal, or sperm retrieval with in vitro fertilization. These options are not always successful, and they may be expensive. We discussed reversible forms of birth control such as condoms, IUD or diaphragms, as well as the option of freezing sperm in a sperm bank prior to the vasectomy procedure. We discussed the importance of avoiding strenuous exercise for four days after vasectomy, and the importance of refraining from any form of ejaculation for seven days after vasectomy. I explained that vasectomy does  not produce immediate sterility so another form of contraceptive must be used until sterility is assured by having semen checked for sperm. Thus, a post vasectomy semen analysis is necessary to confirm sterility. Rarely, vasectomy must be repeated. We discussed the approximately 1 in 2,000 risk of pregnancy after vasectomy for men who have post-vasectomy semen analysis showing absent sperm or rare non-motile sperm. Typical side effects include a small amount of oozing blood, some discomfort and mild swelling in the area of incision.  Vasectomy does not affect sexual performance, function, please, sensation, interest, desire, satisfaction, penile erection, volume of semen or ejaculation. Other rare risks include allergy or adverse reaction to an anesthetic, testicular atrophy, hematoma, infection/abscess, prolonged tenderness of the vas deferens, pain, swelling, painful nodule or scar (called sperm granuloma) or epididymtis. We discussed chronic testicular pain syndrome. This has been reported to occur in as many as 1-2% of men and may be permanent. This can be treated with medication, small procedures or (rarely) surgery. He indicated he would call back if he desires a preprocedure anxiolytic and would need a driver if utilizing   Abbie Sons, [place] [address] [place], [place] [address] [phone]

## 2022-07-08 NOTE — Patient Instructions (Signed)

## 2022-07-10 ENCOUNTER — Other Ambulatory Visit: Payer: Self-pay

## 2022-07-10 NOTE — Telephone Encounter (Signed)
Spoke with pt. Regarding financial responsibility for VAS, he requested a Valium sent over to the pharmacy to take prior to procedure.

## 2022-07-11 MED ORDER — DIAZEPAM 10 MG PO TABS: 10.0000 mg | ORAL_TABLET | Freq: Once | ORAL | 0 refills | Status: AC

## 2022-08-08 ENCOUNTER — Ambulatory Visit (INDEPENDENT_AMBULATORY_CARE_PROVIDER_SITE_OTHER): Payer: BC Managed Care – PPO | Admitting: Urology

## 2022-08-08 ENCOUNTER — Encounter: Payer: Self-pay | Admitting: Urology

## 2022-08-08 VITALS — BP 111/72 | HR 74 | Ht 74.0 in | Wt 220.0 lb

## 2022-08-08 DIAGNOSIS — Z302 Encounter for sterilization: Secondary | ICD-10-CM | POA: Diagnosis not present

## 2022-08-08 DIAGNOSIS — Z9852 Vasectomy status: Secondary | ICD-10-CM

## 2022-08-08 MED ORDER — HYDROCODONE-ACETAMINOPHEN 5-325 MG PO TABS: 1.0000 | ORAL_TABLET | Freq: Four times a day (QID) | ORAL | 0 refills | Status: AC | PRN

## 2022-08-08 NOTE — Patient Instructions (Signed)

## 2022-08-08 NOTE — Progress Notes (Signed)
Vasectomy Procedure Note  Indications: KASHAUN BEBO is a 47 y.o. male who presents today for elective sterilization.  He has been consented for the procedure.  He is aware of the risks and benefits.  He had no additional questions.  He agrees to proceed.  He denies any other significant change since his last visit.  Pre-operative Diagnosis: Elective sterilization  Post-operative Diagnosis: Elective sterilization  Premedication: Valium 10 mg po  Surgeon: Lorin Picket C. Kyannah Climer, M.D  Description: The patient was prepped and draped in the standard fashion.  The right vas deferens was identified and brought superiorly to the anterior scrotal skin.  The skin and vas were then anesthetized utilizing 8 ml 1% lidocaine.  A small stab incision was made and spread with the vas dissector.  The vas was grasped utilizing the vas clamp and elevated out of the incision.  The vas was dissected free from surrounding tissue and vessels and an ~1 cm segment was excised.  The vas lumens were cauterized utilizing electrocautery.  The distal segment was buried in the surrounding sheath with a 3-0 chromic suture.  No significant bleeding was observed.  The vas ends were then dropped back into the hemiscrotum.  The skin was closed with hemostatic pressure.  An identical procedure was performed on the contralateral side.  Clean dry gauze was applied to the incision sites.  The patient tolerated the procedure well.  Complications:None  Recommendations: 1.  No lifting greater than 10 pounds or strenuous activity for 1 week. 2.  Scrotal support for 1-2 weeks. 3.  May shower in 24 hours; no bath, hot tub for 1 week 4.  No intercourse for at least 7 days and resume based on level of discomfort  5.  Continue alternate contraception for 12 weeks.  6.  Call for significant pain, swelling, redness, drainage or fever greater than 100.5. 7.  Rx hydrocodone/APAP 5/325 1-2 every 6 hours prn pain. 8.  Follow-up semen analysis  in 12 weeks.   Irineo Axon, MD

## 2022-10-31 ENCOUNTER — Other Ambulatory Visit: Payer: BC Managed Care – PPO

## 2022-10-31 DIAGNOSIS — Z9852 Vasectomy status: Secondary | ICD-10-CM

## 2023-10-12 ENCOUNTER — Other Ambulatory Visit: Payer: Self-pay

## 2023-10-12 ENCOUNTER — Emergency Department
Admission: EM | Admit: 2023-10-12 | Discharge: 2023-10-12 | Disposition: A | Discharge status: Home / Self Care | Payer: Worker's Compensation | Attending: Emergency Medicine | Admitting: Emergency Medicine

## 2023-10-12 DIAGNOSIS — Z77098 Contact with and (suspected) exposure to other hazardous, chiefly nonmedicinal, chemicals: Secondary | ICD-10-CM | POA: Insufficient documentation

## 2023-10-12 DIAGNOSIS — H5789 Other specified disorders of eye and adnexa: Secondary | ICD-10-CM | POA: Diagnosis present

## 2023-10-12 MED ORDER — FLUORESCEIN SODIUM 1 MG OP STRP
1.0000 | ORAL_STRIP | Freq: Once | OPHTHALMIC | Status: AC
  Administered 2023-10-12: 1 via OPHTHALMIC
  Filled 2023-10-12: qty 1

## 2023-10-12 MED ORDER — TETRACAINE HCL 0.5 % OP SOLN
2.0000 [drp] | Freq: Once | OPHTHALMIC | Status: AC
  Administered 2023-10-12: 2 [drp] via OPHTHALMIC
  Filled 2023-10-12: qty 4

## 2023-10-12 NOTE — ED Triage Notes (Signed)
 Pt to ed from work via POV for chemical exposure in eyes that happened around 630pm tonight. He rinsed his eyes well but is still irritated.    Sodium hydroxide  D-gluco-heptonic acid, monosodium salt (Caustic)  Pt is caox4, in no acute distress and ambulatory in triage.

## 2023-10-12 NOTE — ED Provider Notes (Signed)
 [facility] Emergency Department Provider Note     Event Date/Time   First MD Initiated Contact with Patient 10/12/23 2024     (approximate)   History   Chemical Exposure (In eyes)   HPI  Brian Bradley is a 48 y.o. male with a noncontributory medical history, presents to the ED for evaluation of a chemical exposure to the eyes.  Patient reports the exposure occurred approximately 7 pm.  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  He stepped out of the container, and removed his PPE, and another employee had inadvertently turned on the sprayer.  The patient received a chemical splash to face, just above the right brow, with runoff into the right eye.  He performed an onsite eye-wash with 1-1/2 bottles of a 3 oz bottle of commercial eye wash solution, performing a wash for approximately 5 minutes.  He presents himself here, with c/o mild eye irritation on the right. He denies vision loss, significant skin irritation, nausea, vomiting, or ingestion.  No reports of FCS, NVD, cough, congestion, shortness of breath  Patient presents with the MSD which describes his exposure to a 1% dilution of Chem[employer] Product # 49223  aka sodium hydroxide + D-gluco-Heptonic acid, monosodium salt.  The product is a known eye /skin irritant. The MSD recommends 45 minute eyelid open flush; cleanse skin with soap & water  Physical Exam   Triage Vital Signs: ED Triage Vitals  Encounter Vitals Group     BP 10/12/23 1957 (!) 144/90     Girls Systolic BP Percentile --      Girls Diastolic BP Percentile --      Boys Systolic BP Percentile --      Boys Diastolic BP Percentile --      Pulse Rate 10/12/23 1957 66     Resp 10/12/23 1957 16     Temp 10/12/23 1957 97.9 F (36.6 C)     Temp Source 10/12/23 1957 Oral     SpO2 10/12/23 1957 98 %     Weight --      Height 10/12/23 1958 6' 2 (1.88 m)     Head Circumference --      Peak Flow --      Pain  Score 10/12/23 1958 2     Pain Loc --      Pain Education --      Exclude from Growth Chart --     Most recent vital signs: Vitals:   10/12/23 1957 10/12/23 2218  BP: (!) 144/90 139/82  Pulse: 66 69  Resp: 16   Temp: 97.9 F (36.6 C)   SpO2: 98% 100%    General Awake, no distress. NAD HEENT NCAT. PERRL. EOMI. no blepharitis noted.  No conjunctival injection, icterus, or hemorrhage appreciated.  No rhinorrhea. Mucous membranes are moist.  CV:  Good peripheral perfusion.  RESP:  Normal effort.  ABD:  No distention.    ED Results / Procedures / Treatments   Labs (all labs ordered are listed, but only abnormal results are displayed) Labs Reviewed - No data to display  EKG   RADIOLOGY  No results found.   PROCEDURES:  Critical Care performed: No  Procedures  Tetracaine  ii ggts OD No fluorescein  dye uptake Initial pH is 8 OD 2 L normal saline flush with Joesph lens to the right eye x 50 minutes Post flush pH 7 OD   MEDICATIONS ORDERED IN ED: Medications  fluorescein  ophthalmic  strip 1 strip (1 strip Both Eyes Given 10/12/23 2053)  tetracaine  (PONTOCAINE) 0.5 % ophthalmic solution 2 drop (2 drops Right Eye Given 10/12/23 2054)     IMPRESSION / MDM / ASSESSMENT AND PLAN / ED COURSE  I reviewed the triage vital signs and the nursing notes.                              Differential diagnosis includes, but is not limited to, ecchymosis, chemical conjunctivitis, skin irritant, contact dermatitis, pneumonitis  Patient's presentation is most consistent with acute presentation with potential threat to life or bodily function.  Patient's diagnosis is consistent with ecchymosis to the right eye.  Patient presented in no acute distress with no acute visual changes.  No evidence of any gross corneal abrasion or ulceration.  No fluorescein  dye uptake noted.  Patient tolerated 50+ minute Morgan lens flush to the right eye.  Initial preprocedure pH was 8.  Postprocedure, pH  normalized to 7.  Patient door significant movement of symptoms.  Patient will be discharged home with instructions to use OTC rewetting drops. Patient is to follow up with his PCP or local occupational provider approved by his employer, as suggested, as needed or otherwise directed. Patient is given ED precautions to return to the ED for any worsening or new symptoms.   FINAL CLINICAL IMPRESSION(S) / ED DIAGNOSES   Final diagnoses:  Chemical exposure of eye     Rx / DC Orders   ED Discharge Orders     None        Note:  This document was prepared using Dragon voice recognition software and may include unintentional dictation errors.    Loyd Candida LULLA Aldona, PA-C 10/17/23 2348    Dicky Anes, MD 10/18/23 [phone]

## 2023-10-12 NOTE — Discharge Instructions (Signed)
 Your exam is reassuring.  We flushed your eye for 30+ minutes, and achieved a neutral pH of 7.  Consider OTC rewetting drops for eye irritation and dryness.  Follow-up with your primary provider for ongoing evaluation.  Return to the ED if needed.
